# Patient Record
Sex: Female | Born: 1980 | Race: White | Hispanic: No | Marital: Married | Smoking: Never smoker
Health system: Southern US, Community
[De-identification: ages and names within clinical notes are randomized; demographics above are authoritative.]

## PROBLEM LIST (undated history)

## (undated) DIAGNOSIS — Z87440 Personal history of urinary (tract) infections: Secondary | ICD-10-CM

## (undated) DIAGNOSIS — Z8619 Personal history of other infectious and parasitic diseases: Secondary | ICD-10-CM

## (undated) DIAGNOSIS — T7840XA Allergy, unspecified, initial encounter: Secondary | ICD-10-CM

## (undated) DIAGNOSIS — B373 Candidiasis of vulva and vagina: Secondary | ICD-10-CM

## (undated) DIAGNOSIS — D649 Anemia, unspecified: Secondary | ICD-10-CM

## (undated) DIAGNOSIS — IMO0002 Reserved for concepts with insufficient information to code with codable children: Secondary | ICD-10-CM

## (undated) HISTORY — DX: Personal history of other infectious and parasitic diseases: Z86.19

## (undated) HISTORY — DX: Personal history of urinary (tract) infections: Z87.440

## (undated) HISTORY — DX: Reserved for concepts with insufficient information to code with codable children: IMO0002

## (undated) HISTORY — DX: Allergy, unspecified, initial encounter: T78.40XA

## (undated) HISTORY — DX: Anemia, unspecified: D64.9

## (undated) HISTORY — PX: OTHER SURGICAL HISTORY: SHX169

## (undated) HISTORY — PX: WISDOM TOOTH EXTRACTION: SHX21

## (undated) HISTORY — DX: Candidiasis of vulva and vagina: B37.3

---

## 2001-02-10 ENCOUNTER — Other Ambulatory Visit: Admission: RE | Admit: 2001-02-10 | Discharge: 2001-02-10 | Payer: Self-pay | Admitting: Obstetrics and Gynecology

## 2002-08-24 DIAGNOSIS — B3731 Acute candidiasis of vulva and vagina: Secondary | ICD-10-CM

## 2002-08-24 DIAGNOSIS — B373 Candidiasis of vulva and vagina: Secondary | ICD-10-CM

## 2002-08-24 DIAGNOSIS — Z87440 Personal history of urinary (tract) infections: Secondary | ICD-10-CM

## 2002-08-24 HISTORY — DX: Personal history of urinary (tract) infections: Z87.440

## 2002-08-24 HISTORY — DX: Acute candidiasis of vulva and vagina: B37.31

## 2002-08-24 HISTORY — DX: Candidiasis of vulva and vagina: B37.3

## 2004-05-07 ENCOUNTER — Other Ambulatory Visit: Admission: RE | Admit: 2004-05-07 | Discharge: 2004-05-07 | Payer: Self-pay | Admitting: Obstetrics and Gynecology

## 2004-10-02 ENCOUNTER — Ambulatory Visit: Payer: Self-pay | Admitting: Internal Medicine

## 2005-06-25 ENCOUNTER — Other Ambulatory Visit: Admission: RE | Admit: 2005-06-25 | Discharge: 2005-06-25 | Payer: Self-pay | Admitting: Obstetrics and Gynecology

## 2005-08-24 HISTORY — PX: OTHER SURGICAL HISTORY: SHX169

## 2006-06-24 DIAGNOSIS — IMO0002 Reserved for concepts with insufficient information to code with codable children: Secondary | ICD-10-CM

## 2006-06-24 HISTORY — DX: Reserved for concepts with insufficient information to code with codable children: IMO0002

## 2006-06-24 HISTORY — PX: PARTIAL HYMENECTOMY: SHX327

## 2006-06-28 ENCOUNTER — Other Ambulatory Visit: Admission: RE | Admit: 2006-06-28 | Discharge: 2006-06-28 | Payer: Self-pay | Admitting: Obstetrics and Gynecology

## 2006-07-12 ENCOUNTER — Ambulatory Visit (HOSPITAL_COMMUNITY): Admission: RE | Admit: 2006-07-12 | Discharge: 2006-07-12 | Payer: Self-pay | Admitting: Obstetrics and Gynecology

## 2006-08-24 HISTORY — PX: OTHER SURGICAL HISTORY: SHX169

## 2007-12-06 ENCOUNTER — Ambulatory Visit: Payer: Self-pay | Admitting: Internal Medicine

## 2007-12-12 ENCOUNTER — Encounter (INDEPENDENT_AMBULATORY_CARE_PROVIDER_SITE_OTHER): Payer: Self-pay | Admitting: *Deleted

## 2009-01-03 ENCOUNTER — Inpatient Hospital Stay (HOSPITAL_COMMUNITY): Admission: AD | Admit: 2009-01-03 | Discharge: 2009-01-06 | Payer: Self-pay | Admitting: Obstetrics and Gynecology

## 2009-10-03 ENCOUNTER — Telehealth: Payer: Self-pay | Admitting: Internal Medicine

## 2010-07-07 ENCOUNTER — Ambulatory Visit: Payer: Self-pay | Admitting: Internal Medicine

## 2010-07-07 DIAGNOSIS — M533 Sacrococcygeal disorders, not elsewhere classified: Secondary | ICD-10-CM | POA: Insufficient documentation

## 2010-07-07 DIAGNOSIS — J309 Allergic rhinitis, unspecified: Secondary | ICD-10-CM | POA: Insufficient documentation

## 2010-07-11 ENCOUNTER — Ambulatory Visit: Payer: Self-pay | Admitting: Internal Medicine

## 2010-07-15 LAB — CONVERTED CEMR LAB
AST: 22 units/L (ref 0–37)
Albumin: 4 g/dL (ref 3.5–5.2)
Alkaline Phosphatase: 62 units/L (ref 39–117)
Basophils Absolute: 0 10*3/uL (ref 0.0–0.1)
Bilirubin, Direct: 0.1 mg/dL (ref 0.0–0.3)
Calcium: 9.2 mg/dL (ref 8.4–10.5)
Eosinophils Relative: 3.8 % (ref 0.0–5.0)
GFR calc non Af Amer: 87.93 mL/min (ref >60)
Glucose, Bld: 95 mg/dL (ref 70–99)
HDL: 56.3 mg/dL (ref >39.00)
LDL Cholesterol: 114 mg/dL — ABNORMAL HIGH (ref 0–99)
Lymphocytes Relative: 34 % (ref 12.0–46.0)
Monocytes Relative: 8.2 % (ref 3.0–12.0)
Neutrophils Relative %: 53.7 % (ref 43.0–77.0)
Platelets: 330 10*3/uL (ref 150.0–400.0)
Potassium: 4.5 meq/L (ref 3.5–5.1)
RDW: 12.5 % (ref 11.5–14.6)
Sodium: 138 meq/L (ref 135–145)
TSH: 0.75 microintl units/mL (ref 0.35–5.50)
Total Bilirubin: 0.6 mg/dL (ref 0.3–1.2)
Total CHOL/HDL Ratio: 3
VLDL: 26.2 mg/dL (ref 0.0–40.0)
WBC: 6.6 10*3/uL (ref 4.5–10.5)

## 2010-09-21 LAB — CONVERTED CEMR LAB
Basophils Absolute: 0 10*3/uL (ref 0.0–0.1)
Basophils Relative: 0.1 % (ref 0.0–1.0)
Bilirubin, Direct: 0.1 mg/dL (ref 0.0–0.3)
Calcium: 9.4 mg/dL (ref 8.4–10.5)
Cholesterol: 189 mg/dL (ref 0–200)
Creatinine, Ser: 0.9 mg/dL (ref 0.4–1.2)
GFR calc Af Amer: 97 mL/min
Glucose, Bld: 82 mg/dL (ref 70–99)
HCT: 41.8 % (ref 36.0–46.0)
Hemoglobin: 14 g/dL (ref 12.0–15.0)
MCHC: 33.4 g/dL (ref 30.0–36.0)
Monocytes Absolute: 0.4 10*3/uL (ref 0.1–1.0)
Monocytes Relative: 7.9 % (ref 3.0–12.0)
Neutro Abs: 2.6 10*3/uL (ref 1.4–7.7)
RDW: 12.2 % (ref 11.5–14.6)
Sodium: 140 meq/L (ref 135–145)
TSH: 0.55 microintl units/mL (ref 0.35–5.50)
Total Protein: 7.5 g/dL (ref 6.0–8.3)
Triglycerides: 64 mg/dL (ref 0–149)

## 2010-09-23 NOTE — Progress Notes (Signed)
Summary: FLU SYMPTOMS W/FEVER  Phone Note Call from Patient Call back at [phone]   Caller: Patient Reason for Call: Talk to Nurse Summary of Call: PATIENT CALLING C/O OF FEVER, ACHES, SWEATS, CHILLS, FOR 4 DAYS.  PATIENT BELIEVES SHE HAS THE FLU.  SHE IS BREAST FEEDING AND IS CONCERNED.  PATIENT FEELS SHE NEEDS TO BE SEEN BY SOMEONE AT OUR OFFICE TODAY.  SCHEDULES ARE ALL FULL.  PATIENT ASKING TO SPEAK W/ A NURSE. Initial call taken by: Magdalen Spatz [facility],  October 03, 2009 8:32 AM  Follow-up for Phone Call        APPT SCHEDULE.......................Marland KitchenFelecia Deloach CMA  October 03, 2009 8:51 AM

## 2010-09-23 NOTE — Assessment & Plan Note (Signed)
Summary: cpx/cbs /tail bone hurts   Vital Signs:  Patient profile:   30 year old female Height:      63.5 inches Weight:      136.2 pounds BMI:     23.83 Temp:     98.0 degrees F oral Pulse rate:   72 / minute Resp:     14 per minute BP sitting:   110 / 62  (left arm) Cuff size:   large  Vitals Entered By: Shonna Chock CMA (July 07, 2010 1:13 PM) CC: CPX    CC:  CPX .  History of Present Illness:  Kaitlin Hooper is here for a physical; she has had several months of coccygeal area discomfort which does appear to be lessening. She raises question of  a more serious process.  Preventive Screening-Counseling & Management  Caffeine-Diet-Exercise     Does Patient Exercise: yes  Current Medications (verified): 1)  None  Allergies (verified): No Known Drug Allergies  Past History:  Past Medical History: Allergic rhinitis, seasonal  Past Surgical History: Wisdom teeth extraction Gyn surgery 2007: hymenectomy, labial reduction, skin tag removal; G1 P 1   Family History: mother: thyroid nodule benign paternal grandfather: MI >48 paternal grandmother: MI >25 father: open heart surgery, CABG  Social History: Never Smoked Alcohol use-yes (rare) healthy diet Married Regular exercise-yes; 2-3X/ week as weights & cardio  Review of Systems  The patient denies anorexia, fever, weight loss, weight gain, vision loss, decreased hearing, hoarseness, chest pain, syncope, dyspnea on exertion, peripheral edema, prolonged cough, headaches, hemoptysis, abdominal pain, melena, hematochezia, severe indigestion/heartburn, hematuria, suspicious skin lesions, depression, unusual weight change, abnormal bleeding, enlarged lymph nodes, and angioedema.    Physical Exam  General:  well-nourished;alert,appropriate and cooperative throughout examination Head:  Normocephalic and atraumatic without obvious abnormalities. Eyes:  No corneal or conjunctival inflammation noted. Perrla.  Funduscopic exam benign, without hemorrhages, exudates or papilledema. Ears:  External ear exam shows no significant lesions or deformities.  Otoscopic examination reveals clear canals, tympanic membranes are intact bilaterally without bulging, retraction, inflammation or discharge. Hearing is grossly normal bilaterally. Nose:  External nasal examination shows no deformity or inflammation. Nasal mucosa are pink and moist without lesions or exudates. Mouth:  Oral mucosa and oropharynx without lesions or exudates.  Teeth in good repair. Neck:  No deformities, masses, or tenderness noted. Lungs:  Normal respiratory effort, chest expands symmetrically. Lungs are clear to auscultation, no crackles or wheezes. Heart:  Normal rate and regular rhythm. S1 and S2 normal without gallop, murmur, click, rub .S4 Abdomen:  Bowel sounds positive,abdomen soft and non-tender without masses, organomegaly or hernias noted. Aorta palpable ; no AAA Genitalia:  [facility] Msk:  No deformity or scoliosis noted of thoracic or lumbar spine.  Coccyx very superficial & slightly tender to palpation Pulses:  R and L carotid,radial,dorsalis pedis and posterior tibial pulses are full and equal bilaterally Extremities:  No clubbing, cyanosis, edema, or deformity noted with normal full range of motion of all joints.   Neurologic:  alert & oriented X3 and DTRs symmetrical and normal.   Skin:  5  tatooes Cervical Nodes:  No lymphadenopathy noted Axillary Nodes:  No palpable lymphadenopathy Psych:  memory intact for recent and remote, normally interactive, and good eye contact.     Impression & Recommendations:  Problem # 1:  ROUTINE GENERAL MEDICAL EXAM@HEALTH  CARE FACL (ICD-V70.0)  Problem # 2:  COCCYGEAL PAIN (ICD-724.79)  Orders: T-Coccyx/Sacrum 2 Views (72220TC) to be done with next menses  Problem # 3:  ALLERGIC RHINITIS (ICD-477.9)  Her updated medication list for this problem includes:    Loratadine  10 Mg Tabs (Loratadine) .Marland Kitchen... 1 once daily as needed for allergies  Complete Medication List: 1)  Loratadine 10 Mg Tabs (Loratadine) .Marland Kitchen.. 1 once daily as needed for allergies  Patient Instructions: 1)  Neti pot once daily as needed for congestion.Xray of coccyx with next menses . Use a rubber donut as needed. Consider fasting labs: 2)  BMP; 3)  Hepatic Panel ; 4)  Lipid Panel ; 5)  TSH; 6)  CBC w/ Diff . Prescriptions: LORATADINE 10 MG TABS (LORATADINE) 1 once daily as needed for allergies  #90 x 3   Entered and Authorized by:   Marga Melnick MD   Signed by:   Marga Melnick MD on 07/07/2010   Method used:   Print then Give to Patient   RxID:   [id number]    Orders Added: 1)  Est. Patient 18-39 years [99395] 2)  T-Coccyx/Sacrum 2 Views [72220TC]

## 2010-11-21 ENCOUNTER — Observation Stay (HOSPITAL_COMMUNITY)
Admission: AD | Admit: 2010-11-21 | Discharge: 2010-11-22 | Disposition: A | Discharge status: Home / Self Care | Payer: 59 | Source: Ambulatory Visit | Attending: Obstetrics and Gynecology | Admitting: Obstetrics and Gynecology

## 2010-11-21 ENCOUNTER — Inpatient Hospital Stay (HOSPITAL_COMMUNITY): Payer: 59

## 2010-11-21 DIAGNOSIS — R1032 Left lower quadrant pain: Secondary | ICD-10-CM | POA: Insufficient documentation

## 2010-11-21 DIAGNOSIS — N211 Calculus in urethra: Secondary | ICD-10-CM | POA: Insufficient documentation

## 2010-11-21 DIAGNOSIS — N200 Calculus of kidney: Principal | ICD-10-CM | POA: Insufficient documentation

## 2010-11-21 LAB — CBC
HCT: 39.8 % (ref 36.0–46.0)
Hemoglobin: 13.7 g/dL (ref 12.0–15.0)
MCH: 30.5 pg (ref 26.0–34.0)
MCHC: 34.4 g/dL (ref 30.0–36.0)
RDW: 12.8 % (ref 11.5–15.5)

## 2010-11-21 LAB — COMPREHENSIVE METABOLIC PANEL
Alkaline Phosphatase: 54 U/L (ref 39–117)
BUN: 13 mg/dL (ref 6–23)
Calcium: 9.9 mg/dL (ref 8.4–10.5)
GFR calc non Af Amer: >60 mL/min (ref >60)
Glucose, Bld: 122 mg/dL — ABNORMAL HIGH (ref 70–99)
Total Protein: 7.4 g/dL (ref 6.0–8.3)

## 2010-11-21 LAB — URINALYSIS, ROUTINE W REFLEX MICROSCOPIC
Glucose, UA: NEGATIVE mg/dL
Ketones, ur: NEGATIVE mg/dL
Leukocytes, UA: NEGATIVE
Nitrite: NEGATIVE
Protein, ur: NEGATIVE mg/dL
pH: 7.5 (ref 5.0–8.0)

## 2010-11-21 LAB — URINE MICROSCOPIC-ADD ON

## 2010-11-23 LAB — URINE CULTURE: Colony Count: 15000

## 2010-11-26 NOTE — Discharge Summary (Signed)
  [name], Kaitlin Hooper            ACCOUNT NO.:  [id number]  MEDICAL RECORD NO.:  [id number]           PATIENT TYPE:  I  LOCATION:  9318                          [facility]:  [facility]  PHYSICIAN:  Janine Limbo, M.D.DATE OF BIRTH:  01-08-1981  DATE OF ADMISSION:  11/21/2010 DATE OF DISCHARGE:  11/22/2010                              DISCHARGE SUMMARY   ADMISSION DIAGNOSIS:  Left ureteral kidney stone.  DISCHARGE DIAGNOSIS:  Left ureteral kidney stone.  PROCEDURE THIS ADMISSION:  CT scan on November 04, 2010.  HISTORY OF PRESENT ILLNESS:  Ms. Kaitlin Hooper is a 30 year old female, para 1-0-1-1, who presented to the emergency department at [facility] [place] on November 04, 2010, complaining of sharp left lower quadrant pain.  Her evaluation showed a hemoglobin of 13.7.  Her hematocrit was 39.8%.  Her white blood cell count was 10,100.  Her platelet count was 343,000.  Her urinalysis showed bacteria but also a trace amount of blood.  Her comprehensive metabolic panel was within normal limits.  The patient was given pain medications and IV fluids.  Her discomfort did not resolve.  A CT scan was performed that showed a 2-mm left distal ureteral stone.  The decision was made to admit the patient for treatment and observation.  PHYSICAL EXAMINATION:  The patient's abdomen was soft and nontender. She did not have CVA tenderness.  HOSPITAL COURSE:  The patient was started on Ancef 1 g IV every 6 hours. She was given vigorous IV fluid hydration.  On the morning of November 22, 2010, the patient reported that her pain had resolved.  She quickly tolerated a regular diet and she was adequately relieved with Pain Medicine by mouth.  The decision was made to discharge the patient to home.  DISCHARGE INSTRUCTIONS:  The patient will call for questions or concerns.  She will call for any increase in discomfort.  She will return to the office in 3 weeks for followup examination.  DISCHARGE  MEDICATIONS: 1. Motrin 800 mg 1 tablet every 8 hours as needed for mild-to-moderate     pain. 2. Percocet 5/325 1-2 tablets every 4 hours as needed for severe pain. 3. Phenergan 25 mg 1 tablet every 6 hours as needed for nausea. 4. Septra DS 1 tablet twice each day for 7 days. 5. Diflucan 150 mg 1 tablet on 1 occasion only as needed for yeast     vaginitis.     Janine Limbo, M.D.     AVS/MEDQ  D:  11/22/2010  T:  11/23/2010  Job:  [id number]  Electronically Signed by Kirkland Hun M.D. on 11/26/2010 11:15:28 AM

## 2010-12-02 LAB — CBC
HCT: 38.6 % (ref 36.0–46.0)
MCHC: 35.1 g/dL (ref 30.0–36.0)
MCV: 93.6 fL (ref 78.0–100.0)
MCV: 94.9 fL (ref 78.0–100.0)
Platelets: 247 10*3/uL (ref 150–400)
RBC: 4.12 MIL/uL (ref 3.87–5.11)
RDW: 13.5 % (ref 11.5–15.5)
WBC: 11 10*3/uL — ABNORMAL HIGH (ref 4.0–10.5)

## 2011-01-06 NOTE — H&P (Signed)
NAMEARKIE, TAGLIAFERRO NO.:  [id number]   MEDICAL RECORD NO.:  [id number]          PATIENT TYPE:  INP   LOCATION:  9166                          [facility]ILITY:  [facility]   PHYSICIAN:  Dois Davenport A. Rivard, M.D. DATE OF BIRTH:  [birth year]   DATE OF ADMISSION:  01/03/2009  DATE OF DISCHARGE:                              HISTORY & PHYSICAL   Ms. Kaitlin Hooper is a 30 year old gravida 1, para 0 at 39-4/7 weeks who  presents from the office status post evaluation for questionable leaking  with positive pulling and positive fern noted at that time.  Cervix was  3 cm in the office.  There was clear fluid noted.  The patient denied  any contractions.  She may have started leaking in the early morning  hours.   Pregnancy is remarkable for:  1. History of vestibulitis.  2. History of reaction to Monocryl sutures when used for labioplasty.  3. Group B strep negative.   PRENATAL LABORATORY DATA:  Blood type is O+, Rh antibody negative, VDRL  nonreactive, rubella titer positive, hepatitis B surface antigen  negative, HIV was nonreactive.  GC and chlamydia were negative in  October.  Cultures were declined at 36 weeks.  First trimester screen  was normal.  AFP was declined.  Glucola was normal.  Hemoglobin upon  entering the practice was 13.9.  It was 12.0 at 28 weeks.  RPR was  nonreactive.  Group B strep culture was negative at 36 weeks.   HISTORY OF PRESENT PREGNANCY:  The patient entered care at approximately  9 weeks.  She had an ultrasound done at her first visit for dating which  gave an [facility] of Jan 06, 2009, which was approximately different from her  LMP.  First trimester screen was normal.  HIV was done with her second  first trimester screen lab draw which was negative.  She declined AFP.  At 18 weeks, she had another ultrasound showing normal growth with a  posterior placenta.  She had a Glucola at 26 weeks.  At that time, she  was at measuring slightly size less than dates.   However, this rectified  itself at the next visit.  At 30 weeks, she was having significant  issues of sciatica.  She also reported that when she had her vaginal  skin tag repair she did have a reaction to Monocryl suture.  She had  ultrasound at 36 weeks showing normal growth and fluid.  Group B strep  culture was negative at 36 weeks.   OBSTETRICAL HISTORY:  The patient is a primigravida.   MEDICAL HISTORY:  She is a previous Ortho Tri-Cyclen Lo user.  She  stopped in June 2009.  She reports usual childhood illnesses.   SURGICAL HISTORY:  Includes a vaginoplasty labioplasty in 2007 which had  a skin tag removed and had the hymen opened.  She also had her wisdom  teeth removed in '96.  During that vaginoplasty surgery, she did have a  reaction to MONOCRYL suture, and therefore subsequent surgeries we will  recommend Vicryl or chromic.   The patient has a sensitivity to MONOCRYL  sutures.  She has no drug  allergies.   FAMILY HISTORY:  Her paternal grandmother and paternal grandfather had  heart attacks.  Her father had open heart surgery.  Her mother has  elevated cholesterol.  Her maternal grandmother has elevated  cholesterol.  Her maternal grandmother has elevated blood pressure on  medication.  Her mother has a nodule on her thyroid and had one half of  her thyroid removed.  She has no medications now.  Paternal grandmother  had breast cancer.  Paternal grandfather was an alcoholic.   GENETIC HISTORY:  Is unremarkable.   SOCIAL HISTORY:  The patient is married to the father of baby.  He is  involved and supportive.  His name is Gretchen Portela.  The patient is  Caucasian.  She denies religious affiliation.  She has a bachelor's  degree.  She has been a Veterinary surgeon.  She also works at [employer].  Her  husband has a bachelor's degree and is an Retail banker.  She has  been followed by the certified nurse midwife service at [facility].  She denies any alcohol, drug  or tobacco use during this pregnancy.   PHYSICAL EXAMINATION:  VITAL SIGNS:  Stable.  The patient is febrile.  HEENT:  Within normal limits.  LUNGS:  Breath sounds are clear.  HEART:  Regular rate and rhythm without murmur.  BREASTS:  Soft and nontender.  ABDOMEN:  Fundal height is approximately 39 cm.  Estimated fetal weight  7-8 pounds.  Uterine contractions every 3-5 minutes, 50 seconds in  duration, mild quality.  Fetal heart rate is reactive with no  decelerations.  The patient is noted be leaking clear fluid.  Cervix is  2+, 80%, vertex at minus 1 station.  Cervix was slightly posterior.  EXTREMITIES:  Deep tendon reflexes are 2+ without clonus.  There is a  trace edema noted.   IMPRESSION:  1. Intrauterine pregnancy at 39-4/7 weeks.  2. Probable prolonged rupture of membranes.  3. Negative group B strep.   PLAN:  1. Admit to birthing suite per consult with Dr. Estanislado Pandy as attending      physician.  2. Routine certified nurse midwife orders.  3. Reviewed status of probable premature prolonged rupture of      membranes prior to labor with the patient and her husband and      addressed the risk issues of infection.  I recommended the patient      consider Pitocin augmentation.  The patient and her husband were      agreeable with this plan.  4. Epidural p.r.n.  5. Plan chromic or Vicryl sutures secondary to history of reaction to      Monocryl sutures.      Renaldo Reel Emilee Hero, C.N.M.      Crist Fat Rivard, M.D.  Electronically Signed    VLL/MEDQ  D:  01/03/2009  T:  01/03/2009  Job:  [id number]

## 2011-01-09 NOTE — Op Note (Signed)
Kaitlin Hooper, Kaitlin Hooper            ACCOUNT NO.:  [id number]   MEDICAL RECORD NO.:  [id number]          PATIENT TYPE:  AMB   LOCATION:  SDC                           [facility]:  [facility]   PHYSICIAN:  Dois Davenport A. Rivard, M.D. DATE OF BIRTH:  03/25/1981   DATE OF PROCEDURE:  07/12/2006  DATE OF DISCHARGE:                                 OPERATIVE REPORT   PREOPERATIVE DIAGNOSIS:  Dyspareunia.   POSTOPERATIVE DIAGNOSIS:  Dyspareunia.   ANESTHESIA:  General.   PROCEDURE:  Partial hymenectomy and vulvoplasty.   SURGEON:  Crist Fat. Rivard, M.D.   ASSISTANTMarquis Lunch. Adline Peals.   ESTIMATED BLOOD LOSS:  Minimal.   PROCEDURE:  After being informed of the planned procedure with possible  complications, including bleeding, infection and scar tissue -- informed was  obtained.  The patient was taken to OR #3, given general anesthesia with  laryngeal mask without complication.  She was placed in the lithotomy  position, prepped and draped in a sterile fashion.   Gynecologic exam reveals a midline high mantle band that had ruptured three-  quarters posteriorly, with a remnant anteriorly and posteriorly as well as a  thickened posterior hymen that has remained intact.  It also reveals  enlarged upper portion of labia minora bilaterally.  We start by  infiltrating the posterior hymen and sharply remove it from 4 o'clock to 7  o'clock.  The vaginal mucosa is slightly undermined and is then closed  paradoxically with simple sutures of 4-0 Monocryl.  Hemostasis is adequate.  The anterior portion of the hymeneal band near the urethral opening is then  infiltrated with Nesacaine 1% (just as the posterior hymen was previously)  and resected.  Hemostasis was completed with cautery and the skin was closed  with simple sutures of 4-0 Monocryl.  Labia minores were then marked for the  site of excision, infiltrated with Nesacaine 1%.  We proceed with excision  of the excess upper portion of labia minora.   Under-skin hemostasis was  completed with cautery and then the skin on each side is closed with simple  sutures of 4-0 Monocryl.  Hemostasis was adequate.  After the procedure we  note a vulvar anatomy return to normal.  Estrace cream was applied to the  site.  Instruments and sponge count was complete x2.  Estimated blood loss  was minimal.  Procedure was very well tolerated by the patient, who was  taken to the recovery room in a well and stable condition.     Crist Fat Rivard, M.D.  Electronically Signed    SAR/MEDQ  D:  07/12/2006  T:  07/12/2006  Job:  [id number]

## 2011-08-25 NOTE — L&D Delivery Note (Signed)
Delivery Note At 5:36 AM a viable female was delivered via Vaginal, Spontaneous Delivery (Presentation: ; Occiput Anterior).  Loose nuchal cord easily reduced, APGAR: 9, 9; weight . pending  Placenta status: Intact, Spontaneous.  Cord: 3 vessels with the following complications: None.  Cord pH: n/a  Anesthesia: Epidural  Episiotomy: None Lacerations: sm r periurethral hemostatic, no repair Suture Repair: n/a Est. Blood Loss (mL): 250  Mom to postpartum.  Baby to nursery-stable. Skin-skin Mom plans to BF   Elesa Garman M 01/08/2012, 6:02 AM

## 2011-11-05 ENCOUNTER — Encounter (INDEPENDENT_AMBULATORY_CARE_PROVIDER_SITE_OTHER): Payer: 59

## 2011-11-05 DIAGNOSIS — Z331 Pregnant state, incidental: Secondary | ICD-10-CM

## 2011-12-01 ENCOUNTER — Ambulatory Visit (INDEPENDENT_AMBULATORY_CARE_PROVIDER_SITE_OTHER): Payer: 59 | Admitting: Obstetrics and Gynecology

## 2011-12-01 ENCOUNTER — Encounter: Payer: Self-pay | Admitting: Obstetrics and Gynecology

## 2011-12-01 VITALS — BP 92/64 | Wt 165.0 lb

## 2011-12-01 DIAGNOSIS — Z3480 Encounter for supervision of other normal pregnancy, unspecified trimester: Secondary | ICD-10-CM

## 2011-12-01 NOTE — Patient Instructions (Signed)

## 2011-12-01 NOTE — Progress Notes (Signed)
Pressure- Present Having B/H No vaginal discharge

## 2011-12-02 DIAGNOSIS — Z3480 Encounter for supervision of other normal pregnancy, unspecified trimester: Secondary | ICD-10-CM | POA: Insufficient documentation

## 2011-12-02 NOTE — Progress Notes (Signed)
Patient ID: Kaitlin Hooper, female   DOB: 05/13/1981, 31 y.o.   MRN: [id number] S: feeling uc off and on not regular, requests no vag exam abd soft, gravid, nt VTX per leopolds A: 33 week IUP P s/s PTL kick counts reviewed,  Sharman Crate

## 2011-12-17 ENCOUNTER — Ambulatory Visit (INDEPENDENT_AMBULATORY_CARE_PROVIDER_SITE_OTHER): Payer: 59 | Admitting: Obstetrics and Gynecology

## 2011-12-17 VITALS — BP 100/60 | Wt 165.0 lb

## 2011-12-17 DIAGNOSIS — Z331 Pregnant state, incidental: Secondary | ICD-10-CM

## 2011-12-17 NOTE — Progress Notes (Signed)
BStrep. Doing Well. RTO 1 week. AVS

## 2011-12-20 LAB — CULTURE, BETA STREP (GROUP B ONLY)

## 2011-12-24 ENCOUNTER — Ambulatory Visit (INDEPENDENT_AMBULATORY_CARE_PROVIDER_SITE_OTHER): Payer: 59 | Admitting: Obstetrics and Gynecology

## 2011-12-24 ENCOUNTER — Encounter: Payer: Self-pay | Admitting: Obstetrics and Gynecology

## 2011-12-24 VITALS — BP 100/62 | Wt 167.0 lb

## 2011-12-24 DIAGNOSIS — Z3480 Encounter for supervision of other normal pregnancy, unspecified trimester: Secondary | ICD-10-CM

## 2011-12-24 NOTE — Progress Notes (Signed)
Patient ID: Kaitlin Hooper, female   DOB: 1981-08-21, 31 y.o.   MRN: [id number] Reviewed s/s   Vag 1 60 -2 VTX Intact GBS neg labor, uc, srom, vag bleeding, ha, visual spots or blurring, epigastric pain, edema to report, and kick counts. F/o office as scheduled. Lavera Guise, CNM

## 2011-12-24 NOTE — Progress Notes (Signed)
Pt c/o thicker discharge than normal

## 2011-12-31 ENCOUNTER — Ambulatory Visit (INDEPENDENT_AMBULATORY_CARE_PROVIDER_SITE_OTHER): Payer: 59 | Admitting: Obstetrics and Gynecology

## 2011-12-31 VITALS — BP 100/60 | Wt 167.0 lb

## 2011-12-31 DIAGNOSIS — Z331 Pregnant state, incidental: Secondary | ICD-10-CM

## 2011-12-31 NOTE — Progress Notes (Signed)
Returned office in 1 week.  Dr. Stefano Gaul

## 2011-12-31 NOTE — Progress Notes (Signed)
Pt states she has no concerns today.  

## 2012-01-07 ENCOUNTER — Telehealth: Payer: Self-pay | Admitting: Obstetrics and Gynecology

## 2012-01-07 ENCOUNTER — Ambulatory Visit (INDEPENDENT_AMBULATORY_CARE_PROVIDER_SITE_OTHER): Payer: 59 | Admitting: Obstetrics and Gynecology

## 2012-01-07 VITALS — BP 110/68 | Wt 168.0 lb

## 2012-01-07 DIAGNOSIS — O368190 Decreased fetal movements, unspecified trimester, not applicable or unspecified: Secondary | ICD-10-CM

## 2012-01-07 NOTE — Telephone Encounter (Signed)
Pam/Avs appt today at 4:30/epic

## 2012-01-07 NOTE — Progress Notes (Signed)
Pt believes she lost her mucous plug . C/O: decreased fetal movement today pt was worked in to arrive at 11:30am rather than 4:30pm.

## 2012-01-07 NOTE — Telephone Encounter (Signed)
Pt called and stated that she was having decrease fetal movement sarting on last pm. Pt was already on schedule to come in today @ 4:00 pm although pt was advised to come in @ 11:30 am. Pt had no other symptoms. She had eaten a good breakfast and had been drinking fluids. Pt went to the gym this am worked out on the treadmill and the baby still was not moving like usual. Mathis Bud

## 2012-01-07 NOTE — Progress Notes (Signed)
Patient reports that the baby doesn't move as much as it used to but that it does move every day.  Nonstress test is reactive.  Membranes stripped.  Return to office in 1 week.  Patient wants [place] to consider induction.

## 2012-01-08 ENCOUNTER — Encounter (HOSPITAL_COMMUNITY): Payer: Self-pay | Admitting: Anesthesiology

## 2012-01-08 ENCOUNTER — Encounter (HOSPITAL_COMMUNITY): Payer: Self-pay | Admitting: *Deleted

## 2012-01-08 ENCOUNTER — Inpatient Hospital Stay (HOSPITAL_COMMUNITY): Payer: Managed Care, Other (non HMO) | Admitting: Anesthesiology

## 2012-01-08 ENCOUNTER — Inpatient Hospital Stay (HOSPITAL_COMMUNITY)
Admission: AD | Admit: 2012-01-08 | Discharge: 2012-01-09 | DRG: 775 | Disposition: A | Discharge status: Home / Self Care | Payer: Managed Care, Other (non HMO) | Source: Ambulatory Visit | Attending: Obstetrics and Gynecology | Admitting: Obstetrics and Gynecology

## 2012-01-08 LAB — OB RESULTS CONSOLE HEPATITIS B SURFACE ANTIGEN: Hepatitis B Surface Ag: NEGATIVE

## 2012-01-08 LAB — CBC
HCT: 33.5 % — ABNORMAL LOW (ref 36.0–46.0)
Hemoglobin: 10.9 g/dL — ABNORMAL LOW (ref 12.0–15.0)
MCH: 28.3 pg (ref 26.0–34.0)
MCHC: 32.5 g/dL (ref 30.0–36.0)
MCV: 87 fL (ref 78.0–100.0)
Platelets: 410 10*3/uL — ABNORMAL HIGH (ref 150–400)
RBC: 3.85 MIL/uL — ABNORMAL LOW (ref 3.87–5.11)
RDW: 13.9 % (ref 11.5–15.5)
WBC: 14.9 10*3/uL — ABNORMAL HIGH (ref 4.0–10.5)

## 2012-01-08 LAB — RPR: RPR Ser Ql: NONREACTIVE

## 2012-01-08 LAB — OB RESULTS CONSOLE RPR: RPR: NONREACTIVE

## 2012-01-08 LAB — OB RESULTS CONSOLE ABO/RH: RH Type: POSITIVE

## 2012-01-08 LAB — OB RESULTS CONSOLE HIV ANTIBODY (ROUTINE TESTING): HIV: NONREACTIVE

## 2012-01-08 MED ORDER — LACTATED RINGERS IV SOLN: INTRAVENOUS | Status: DC

## 2012-01-08 MED ORDER — LIDOCAINE HCL (PF) 1 % IJ SOLN
30.0000 mL | INTRAMUSCULAR | Status: DC | PRN
  Filled 2012-01-08: qty 30

## 2012-01-08 MED ORDER — SENNOSIDES-DOCUSATE SODIUM 8.6-50 MG PO TABS
2.0000 | ORAL_TABLET | Freq: Every day | ORAL | Status: DC
  Administered 2012-01-08: 2 via ORAL

## 2012-01-08 MED ORDER — PRENATAL MULTIVITAMIN CH
1.0000 | ORAL_TABLET | Freq: Every day | ORAL | Status: DC
  Administered 2012-01-08 – 2012-01-09 (×2): 1 via ORAL
  Filled 2012-01-08 (×2): qty 1

## 2012-01-08 MED ORDER — ONDANSETRON HCL 4 MG/2ML IJ SOLN: 4.0000 mg | Freq: Four times a day (QID) | INTRAMUSCULAR | Status: DC | PRN

## 2012-01-08 MED ORDER — DIPHENHYDRAMINE HCL 25 MG PO CAPS: 25.0000 mg | ORAL_CAPSULE | Freq: Four times a day (QID) | ORAL | Status: DC | PRN

## 2012-01-08 MED ORDER — DIBUCAINE 1 % RE OINT: 1.0000 "application " | TOPICAL_OINTMENT | RECTAL | Status: DC | PRN

## 2012-01-08 MED ORDER — EPHEDRINE 5 MG/ML INJ
10.0000 mg | INTRAVENOUS | Status: DC | PRN
  Filled 2012-01-08: qty 4

## 2012-01-08 MED ORDER — DIPHENHYDRAMINE HCL 50 MG/ML IJ SOLN: 12.5000 mg | INTRAMUSCULAR | Status: DC | PRN

## 2012-01-08 MED ORDER — LANOLIN HYDROUS EX OINT: TOPICAL_OINTMENT | CUTANEOUS | Status: DC | PRN

## 2012-01-08 MED ORDER — BENZOCAINE-MENTHOL 20-0.5 % EX AERO: 1.0000 "application " | INHALATION_SPRAY | CUTANEOUS | Status: DC | PRN

## 2012-01-08 MED ORDER — LACTATED RINGERS IV SOLN: 500.0000 mL | Freq: Once | INTRAVENOUS | Status: DC

## 2012-01-08 MED ORDER — OXYCODONE-ACETAMINOPHEN 5-325 MG PO TABS: 1.0000 | ORAL_TABLET | ORAL | Status: DC | PRN

## 2012-01-08 MED ORDER — TETANUS-DIPHTH-ACELL PERTUSSIS 5-2.5-18.5 LF-MCG/0.5 IM SUSP
0.5000 mL | Freq: Once | INTRAMUSCULAR | Status: AC
  Administered 2012-01-09: 0.5 mL via INTRAMUSCULAR
  Filled 2012-01-08: qty 0.5

## 2012-01-08 MED ORDER — IBUPROFEN 600 MG PO TABS: 600.0000 mg | ORAL_TABLET | Freq: Four times a day (QID) | ORAL | Status: DC | PRN

## 2012-01-08 MED ORDER — ACETAMINOPHEN 325 MG PO TABS: 650.0000 mg | ORAL_TABLET | ORAL | Status: DC | PRN

## 2012-01-08 MED ORDER — IBUPROFEN 600 MG PO TABS
600.0000 mg | ORAL_TABLET | Freq: Four times a day (QID) | ORAL | Status: DC
  Administered 2012-01-08 – 2012-01-09 (×4): 600 mg via ORAL
  Filled 2012-01-08 (×4): qty 1

## 2012-01-08 MED ORDER — FENTANYL 2.5 MCG/ML BUPIVACAINE 1/10 % EPIDURAL INFUSION (WH - ANES)
14.0000 mL/h | INTRAMUSCULAR | Status: DC
  Filled 2012-01-08 (×2): qty 60

## 2012-01-08 MED ORDER — OXYTOCIN 20 UNITS IN LACTATED RINGERS INFUSION - SIMPLE
125.0000 mL/h | Freq: Once | INTRAVENOUS | Status: AC
  Administered 2012-01-08: 125 mL/h via INTRAVENOUS

## 2012-01-08 MED ORDER — ZOLPIDEM TARTRATE 5 MG PO TABS: 5.0000 mg | ORAL_TABLET | Freq: Every evening | ORAL | Status: DC | PRN

## 2012-01-08 MED ORDER — EPHEDRINE 5 MG/ML INJ: 10.0000 mg | INTRAVENOUS | Status: DC | PRN

## 2012-01-08 MED ORDER — MEASLES, MUMPS & RUBELLA VAC ~~LOC~~ INJ
0.5000 mL | INJECTION | Freq: Once | SUBCUTANEOUS | Status: DC
  Filled 2012-01-08: qty 0.5

## 2012-01-08 MED ORDER — OXYTOCIN 10 UNIT/ML IJ SOLN: 10.0000 [IU] | Freq: Once | INTRAMUSCULAR | Status: DC

## 2012-01-08 MED ORDER — ONDANSETRON HCL 4 MG/2ML IJ SOLN: 4.0000 mg | INTRAMUSCULAR | Status: DC | PRN

## 2012-01-08 MED ORDER — SIMETHICONE 80 MG PO CHEW: 80.0000 mg | CHEWABLE_TABLET | ORAL | Status: DC | PRN

## 2012-01-08 MED ORDER — OXYTOCIN BOLUS FROM INFUSION
500.0000 mL | Freq: Once | INTRAVENOUS | Status: DC
  Filled 2012-01-08: qty 500
  Filled 2012-01-08: qty 1000

## 2012-01-08 MED ORDER — FENTANYL 2.5 MCG/ML BUPIVACAINE 1/10 % EPIDURAL INFUSION (WH - ANES)
INTRAMUSCULAR | Status: DC | PRN
  Administered 2012-01-08: 14 mL/h via EPIDURAL

## 2012-01-08 MED ORDER — WITCH HAZEL-GLYCERIN EX PADS: 1.0000 "application " | MEDICATED_PAD | CUTANEOUS | Status: DC | PRN

## 2012-01-08 MED ORDER — PHENYLEPHRINE 40 MCG/ML (10ML) SYRINGE FOR IV PUSH (FOR BLOOD PRESSURE SUPPORT)
80.0000 ug | PREFILLED_SYRINGE | INTRAVENOUS | Status: DC | PRN
  Filled 2012-01-08: qty 5

## 2012-01-08 MED ORDER — LACTATED RINGERS IV SOLN: 500.0000 mL | INTRAVENOUS | Status: DC | PRN

## 2012-01-08 MED ORDER — FENTANYL CITRATE 0.05 MG/ML IJ SOLN: 100.0000 ug | INTRAMUSCULAR | Status: DC | PRN

## 2012-01-08 MED ORDER — LIDOCAINE HCL (PF) 1 % IJ SOLN
INTRAMUSCULAR | Status: DC | PRN
  Administered 2012-01-08 (×2): 4 mL

## 2012-01-08 MED ORDER — ONDANSETRON HCL 4 MG PO TABS: 4.0000 mg | ORAL_TABLET | ORAL | Status: DC | PRN

## 2012-01-08 MED ORDER — CITRIC ACID-SODIUM CITRATE 334-500 MG/5ML PO SOLN: 30.0000 mL | ORAL | Status: DC | PRN

## 2012-01-08 MED ORDER — PHENYLEPHRINE 40 MCG/ML (10ML) SYRINGE FOR IV PUSH (FOR BLOOD PRESSURE SUPPORT): 80.0000 ug | PREFILLED_SYRINGE | INTRAVENOUS | Status: DC | PRN

## 2012-01-08 NOTE — H&P (Signed)
Kaitlin Hooper is a 31 y.o. female presenting for onset of ctx since this evening, now becoming stronger and closer together. Denies any VB or LOF, +FM. Pt had NST in office today that was nl for c/o dec FM.  Pregnancy significant for: hx vestibulitis after vaginoplasty r/t monocryl suture  Hx CP cyst at anatomy scan  HPI: Pt began prenatal care at 10weeks. She had a viability [place] at 7w S=D. 1st trimester screen normal. No documentation of AFP results but there is mention of it being drawn at 18w. Anatomy scan at that time was normal, except for L CP cyst that resolved by next [place]. There is not any further hollister in chart, until 33wks. Pt reports having regular care and no further complications. GBS neg at 36wks.   Maternal Medical History:  Reason for admission: Reason for admission: contractions.  Contractions: Onset was 3-5 hours ago.   Frequency: regular.   Duration is approximately 60 seconds.   Perceived severity is moderate.    Fetal activity: Perceived fetal activity is normal.   Last perceived fetal movement was within the past hour.    Prenatal complications: no prenatal complications   OB History    Grav Para Term Preterm Abortions TAB SAB Ect Mult Living   3 1 1  1  1   1      Past Medical History  Diagnosis Date  . History of chicken pox   . Hx: UTI (urinary tract infection) 2004  . Monilial vaginitis 2004  . History of dyspareunia 11/ 2007    secondary to hymenal band + excessive labia minora  . No pertinent past medical history    Past Surgical History  Procedure Date  . Wisdom tooth extraction   . Partial hymenectomy 06/2006  . Vestibulitis 2008  . Vulvoplasty 2007  . No past surgeries    Family History: family history includes Alcohol abuse in her maternal grandfather; Heart disease in her father, paternal grandfather, and paternal grandmother; and Hypertension in her maternal grandmother. Social History:  reports that she has never smoked. She has never  used smokeless tobacco. She reports that she does not drink alcohol or use illicit drugs.  Review of Systems  All other systems reviewed and are negative.    Dilation: 4.5 Effacement (%): 100 Blood pressure 120/89, pulse 108, temperature 98.1 F (36.7 C), temperature source Oral, resp. rate 18, last menstrual period 04/12/2011. Maternal Exam:  Uterine Assessment: Contraction strength is moderate.  Contraction duration is 60 seconds. Contraction frequency is regular.   Abdomen: Patient reports no abdominal tenderness. Fundal height is aga.   Fetal presentation: vertex  Introitus: Normal vulva. Normal vagina.  Pelvis: adequate for delivery.   Cervix: Cervix evaluated by digital exam.     Fetal Exam Fetal Monitor Review: Mode: ultrasound.   Baseline rate: 150.  Variability: moderate (6-25 bpm).   Pattern: no decelerations and no accelerations.    Fetal State Assessment: Category II - tracings are indeterminate.     Physical Exam  Nursing note and vitals reviewed. Constitutional: She is oriented to person, place, and time. She appears well-developed and well-nourished.  HENT:  Head: Normocephalic.  Neck: Normal range of motion.  Cardiovascular: Normal rate, regular rhythm and normal heart sounds.   Respiratory: Effort normal and breath sounds normal.  GI: Soft. Bowel sounds are normal.  Genitourinary: Vagina normal.  Musculoskeletal: Normal range of motion.  Neurological: She is alert and oriented to person, place, and time.  Skin: Skin is  warm and dry.  Psychiatric: She has a normal mood and affect. Her behavior is normal.    Prenatal labs: ABO, Rh:  OPOS Antibody:  neg Rubella:  IMM RPR:   NR HBsAg:   neg HIV:   neg GBS: Negative (04/25 0000)  Pap/GC/CT neg UA cx neg  Assessment/Plan: IUP at [redacted]w[redacted]d GBS neg FHR cat 2 no accels at present, has not been on EFM very long  Early/active labor  Admit to b.s per Dr Pennie Rushing attending Epidural ASAP Expectant  mgmnt, CTO FHR closely  Request complete hollister from office in AM  Kaitlin Hooper M 01/08/2012, 1:50 AM

## 2012-01-08 NOTE — Progress Notes (Signed)
Patient ID: Kaitlin Hooper, female   DOB: [birth year], 31 y.o.   MRN: [id number] .Subjective:  Comfortable with epidural   Objective: BP 122/86  Pulse 145  Temp(Src) 98.2 F (36.8 C) (Oral)  Resp 18  Ht 5' 3.5" (1.613 m)  SpO2 97%  LMP 04/12/2011   FHT:  FHR: 150 bpm, variability: moderate,  accelerations:  Present,  decelerations:  Present occ mild variables UC:   regular, every 2-3 minutes SVE:   Dilation: 7.5 Effacement (%): 100 Station: -1;0 Exam by:: S.Lilliard,cnm  AROM clear fluid   Assessment / Plan: Spontaneous labor, progressing normally GBS neg   Fetal Wellbeing:  Category I Pain Control:  Epidural  Expectant mgmnt  Update physician PRN  Allysen Lazo M 01/08/2012, 4:06 AM

## 2012-01-08 NOTE — Anesthesia Procedure Notes (Signed)
Epidural Patient location during procedure: OB Start time: 01/08/2012 2:25 AM  Staffing Anesthesiologist: Izreal Kock A. Performed by: anesthesiologist   Preanesthetic Checklist Completed: patient identified, site marked, surgical consent, pre-op evaluation, timeout performed, IV checked, risks and benefits discussed and monitors and equipment checked  Epidural Patient position: sitting Prep: site prepped and draped and DuraPrep Patient monitoring: continuous pulse ox and blood pressure Approach: midline Injection technique: LOR air  Needle:  Needle type: Tuohy  Needle gauge: 17 G Needle length: 9 cm Needle insertion depth: 5 cm cm Catheter type: closed end flexible Catheter size: 19 Gauge Catheter at skin depth: 10 cm Test dose: negative and Other  Assessment Events: blood not aspirated, injection not painful, no injection resistance, negative IV test and no paresthesia  Additional Notes Patient identified. Risks and benefits discussed including failed block, incomplete  Pain control, post dural puncture headache, nerve damage, paralysis, blood pressure Changes, nausea, vomiting, reactions to medications-both toxic and allergic and post Partum back pain. All questions were answered. Patient expressed understanding and wished to proceed. Sterile technique was used throughout procedure. Epidural site was Dressed with sterile barrier dressing. No paresthesias, signs of intravascular injection Or signs of intrathecal spread were encountered.  Patient was more comfortable after the epidural was dosed. Please see RN's note for documentation of vital signs and FHR which are stable.

## 2012-01-08 NOTE — Anesthesia Preprocedure Evaluation (Signed)

## 2012-01-08 NOTE — Anesthesia Postprocedure Evaluation (Signed)
  Anesthesia Post-op Note  Patient: Kaitlin Hooper  Procedure(s) Performed: * No procedures listed *  Patient Location: Mother/Baby  Anesthesia Type: Epidural  Level of Consciousness: awake  Airway and Oxygen Therapy: Patient Spontanous Breathing  Post-op Pain: mild  Post-op Assessment: Patient's Cardiovascular Status Stable and Respiratory Function Stable  Post-op Vital Signs: stable  Complications: No apparent anesthesia complications

## 2012-01-09 LAB — CBC
HCT: 31.5 % — ABNORMAL LOW (ref 36.0–46.0)
MCH: 28.8 pg (ref 26.0–34.0)
MCHC: 32.7 g/dL (ref 30.0–36.0)
MCV: 88 fL (ref 78.0–100.0)
Platelets: 307 10*3/uL (ref 150–400)
RDW: 14.1 % (ref 11.5–15.5)

## 2012-01-09 MED ORDER — IBUPROFEN 600 MG PO TABS: 600.0000 mg | ORAL_TABLET | Freq: Four times a day (QID) | ORAL | Status: AC | PRN

## 2012-01-09 NOTE — Discharge Summary (Signed)
Physician Discharge Summary  Patient ID: Kaitlin Hooper MRN: [id number] DOB/AGE: 1980/10/13 31 y.o.  Admit date: 01/08/2012 Discharge date: 01/09/2012  Admission Diagnoses: term pg, active labor  Discharge Diagnoses: Principal Problem:  *NSVD (normal spontaneous vaginal delivery) Lactating Normal involution  Discharged Condition: stable  Hospital Course: active labor, SVD, periurethral lac, normal involution, husband plans vasectomy  Consults: None  Significant Diagnostic Studies: labs: Results for orders placed during the hospital encounter of 01/08/12 (from the past 72 hour(s))  OB RESULTS CONSOLE RPR     Status: Normal      Component Value Range Comment   RPR Nonreactive     OB RESULTS CONSOLE HIV ANTIBODY (ROUTINE TESTING)     Status: Normal      Component Value Range Comment   HIV Non-reactive     OB RESULTS CONSOLE RUBELLA ANTIBODY, IGM     Status: Normal      Component Value Range Comment   Rubella Immune     OB RESULTS CONSOLE HEPATITIS B SURFACE ANTIGEN     Status: Normal      Component Value Range Comment   Hepatitis B Surface Ag Negative     OB RESULTS CONSOLE ABO/RH     Status: Normal      Component Value Range Comment   RH Type  Positive      ABO Grouping O     OB RESULTS CONSOLE ANTIBODY SCREEN     Status: Normal      Component Value Range Comment   Antibody Screen Negative     CBC     Status: Abnormal   Collection Time   01/08/12  2:00 AM      Component Value Range Comment   WBC 14.9 (*) 4.0 - 10.5 (K/uL)    RBC 3.85 (*) 3.87 - 5.11 (MIL/uL)    Hemoglobin 10.9 (*) 12.0 - 15.0 (g/dL)    HCT 14.7 (*) 82.9 - 46.0 (%)    MCV 87.0  78.0 - 100.0 (fL)    MCH 28.3  26.0 - 34.0 (pg)    MCHC 32.5  30.0 - 36.0 (g/dL)    RDW 56.2  13.0 - 86.5 (%)    Platelets 410 (*) 150 - 400 (K/uL)   RPR     Status: Normal   Collection Time   01/08/12  2:00 AM      Component Value Range Comment   RPR NON REACTIVE  NON REACTIVE    CBC     Status: Abnormal   Collection  Time   01/09/12  5:00 AM      Component Value Range Comment   WBC 10.4  4.0 - 10.5 (K/uL)    RBC 3.58 (*) 3.87 - 5.11 (MIL/uL)    Hemoglobin 10.3 (*) 12.0 - 15.0 (g/dL)    HCT 78.4 (*) 69.6 - 46.0 (%)    MCV 88.0  78.0 - 100.0 (fL)    MCH 28.8  26.0 - 34.0 (pg)    MCHC 32.7  30.0 - 36.0 (g/dL)    RDW 29.5  28.4 - 13.2 (%)    Platelets 307  150 - 400 (K/uL)     Treatments: IV hydration  Discharge Exam: Blood pressure 101/68, pulse 82, temperature 97.4 F (36.3 C), temperature source Oral, resp. rate 20, height 5' 3.5" (1.613 m), last menstrual period 04/12/2011, SpO2 98.00%, unknown if currently breastfeeding. General appearance: alert, cooperative and no distress ff sm serosa flow, perineum clean intact, no edema, neg Homan's sign bilaterally  S just using motrin for cramping, little bleeding, breast well, husband plans vasectomy  Disposition: 01-Home or Self Care  Discharge Orders    Future Orders Please Complete By Expires   OB RESULTS CONSOLE RPR      Comments:   This external order was created through the Results Console.   OB RESULTS CONSOLE HIV antibody      Comments:   This external order was created through the Results Console.   OB RESULTS CONSOLE Rubella Antibody      Comments:   This external order was created through the Results Console.   OB RESULTS CONSOLE Hepatitis B surface antigen      Comments:   This external order was created through the Results Console.   OB RESULTS CONSOLE ABO/Rh      Comments:   This external order was created through the Results Console.   OB RESULTS CONSOLE Antibody Screen      Comments:   This external order was created through the Results Console.     Medication List  As of 01/09/2012 10:32 AM   TAKE these medications         ibuprofen 600 MG tablet   Commonly known as: ADVIL,MOTRIN   Take 1 tablet (600 mg total) by mouth every 6 (six) hours as needed for pain.      multivitamin with minerals tablet   Take 1 tablet by  mouth daily.           Follow-up Information    Follow up with [facility] in 6 weeks.        [facility] booklet discussed. SignedLavera Guise 01/09/2012, 10:32 AM

## 2012-01-09 NOTE — Discharge Instructions (Signed)
Vaginal Delivery Care After  Change your pad on each trip to the bathroom.   Wipe gently with toilet paper during your hospital stay. Always wipe from front to back. A spray bottle with warm tap water could also be used or a towelette if available.   Place your soiled pad and toilet paper in a bathroom wastebasket with a plastic bag liner.   During your hospital stay, save any clots. If you pass a clot while on the toilet, do not flush it. Also, if your vaginal flow seems excessive to you, notify nursing personnel.   The first time you get out of bed after delivery, wait for assistance from a nurse. Do not get up alone at any time if you feel weak or dizzy.   Bend and extend your ankles forcefully so that you feel the calves of your legs get hard. Do this 6 times every hour when you are in bed and awake.   Do not sit with one foot under you, dangle your legs over the edge of the bed, or maintain a position that hinders the circulation in your legs.   Many women experience after pains for 2 to 3 days after delivery. These after pains are mild uterine contractions. Ask the nurse for a pain medication if you need something for this. Sometimes breastfeeding stimulates after pains; if you find this to be true, ask for the medication  -  hour before the next feeding.   For you and your infant's protection, do not go beyond the door(s) of the obstetric unit. Do not carry your baby in your arms in the hallway. When taking your baby to and from your room, put your baby in the bassinet and push the bassinet.   Mothers may have their babies in their room as much as they desire.  Document Released: 08/07/2000 Document Revised: 07/30/2011 Document Reviewed: 07/08/2007 [employer] Patient Information 2012 [employer]. Breastfeeding BENEFITS OF BREASTFEEDING For the baby  The first milk (colostrum) helps the baby's digestive system function better.   There are antibodies from the mother in the milk  that help the baby fight off infections.   The baby has a lower incidence of asthma, allergies, and SIDS (sudden infant death syndrome).   The nutrients in breast milk are better than formulas for the baby and helps the baby's brain grow better.   Babies who breastfeed have less gas, colic, and constipation.  For the mother  Breastfeeding helps develop a very special bond between mother and baby.   It is more convenient, always available at the correct temperature and cheaper than formula feeding.   It burns calories in the mother and helps with losing weight that was gained during pregnancy.   It makes the uterus contract back down to normal size faster and slows bleeding following delivery.   Breastfeeding mothers have a lower risk of developing breast cancer.  NURSE FREQUENTLY  A healthy, full-term baby may breastfeed as often as every hour or space his or her feedings to every 3 hours.   How often to nurse will vary from baby to baby. Watch your baby for signs of hunger, not the clock.   Nurse as often as the baby requests, or when you feel the need to reduce the fullness of your breasts.   Awaken the baby if it has been 3 to 4 hours since the last feeding.   Frequent feeding will help the mother make more milk and will prevent problems like   sore nipples and engorgement of the breasts.  BABY'S POSITION AT THE BREAST  Whether lying down or sitting, be sure that the baby's tummy is facing your tummy.   Support the breast with 4 fingers underneath the breast and the thumb above. Make sure your fingers are well away from the nipple and baby's mouth.   Stroke the baby's lips and cheek closest to the breast gently with your finger or nipple.   When the baby's mouth is open wide enough, place all of your nipple and as much of the dark area around the nipple as possible into your baby's mouth.   Pull the baby in close so the tip of the nose and the baby's cheeks touch the breast  during the feeding.  FEEDINGS  The length of each feeding varies from baby to baby and from feeding to feeding.   The baby must suck about 2 to 3 minutes for your milk to get to him or her. This is called a "let down." For this reason, allow the baby to feed on each breast as long as he or she wants. Your baby will end the feeding when he or she has received the right balance of nutrients.   To break the suction, put your finger into the corner of the baby's mouth and slide it between his or her gums before removing your breast from his or her mouth. This will help prevent sore nipples.  REDUCING BREAST ENGORGEMENT  In the first week after your baby is born, you may experience signs of breast engorgement. When breasts are engorged, they feel heavy, warm, full, and may be tender to the touch. You can reduce engorgement if you:   Nurse frequently, every 2 to 3 hours. Mothers who breastfeed early and often have fewer problems with engorgement.   Place light ice packs on your breasts between feedings. This reduces swelling. Wrap the ice packs in a lightweight towel to protect your skin.   Apply moist hot packs to your breast for 5 to 10 minutes before each feeding. This increases circulation and helps the milk flow.   Gently massage your breast before and during the feeding.   Make sure that the baby empties at least one breast at every feeding before switching sides.   Use a breast pump to empty the breasts if your baby is sleepy or not nursing well. You may also want to pump if you are returning to work or or you feel you are getting engorged.   Avoid bottle feeds, pacifiers or supplemental feedings of water or juice in place of breastfeeding.   Be sure the baby is latched on and positioned properly while breastfeeding.   Prevent fatigue, stress, and anemia.   Wear a supportive bra, avoiding underwire styles.   Eat a balanced diet with enough fluids.  If you follow these suggestions,  your engorgement should improve in 24 to 48 hours. If you are still experiencing difficulty, call your lactation consultant or caregiver. IS MY BABY GETTING ENOUGH MILK? Sometimes, mothers worry about whether their babies are getting enough milk. You can be assured that your baby is getting enough milk if:  The baby is actively sucking and you hear swallowing.   The baby nurses at least 8 to 12 times in a 24 hour time period. Nurse your baby until he or she unlatches or falls asleep at the first breast (at least 10 to 20 minutes), then offer the second side.   The baby   is wetting 5 to 6 disposable diapers (6 to 8 cloth diapers) in a 24 hour period by 5 to 6 days of age.   The baby is having at least 2 to 3 stools every 24 hours for the first few months. Breast milk is all the food your baby needs. It is not necessary for your baby to have water or formula. In fact, to help your breasts make more milk, it is best not to give your baby supplemental feedings during the early weeks.   The stool should be soft and yellow.   The baby should gain 4 to 7 ounces per week after he is 4 days old.  TAKE CARE OF YOURSELF Take care of your breasts by:  Bathing or showering daily.   Avoiding the use of soaps on your nipples.   Start feedings on your left breast at one feeding and on your right breast at the next feeding.   You will notice an increase in your milk supply 2 to 5 days after delivery. You may feel some discomfort from engorgement, which makes your breasts very firm and often tender. Engorgement "peaks" out within 24 to 48 hours. In the meantime, apply warm moist towels to your breasts for 5 to 10 minutes before feeding. Gentle massage and expression of some milk before feeding will soften your breasts, making it easier for your baby to latch on. Wear a well fitting nursing bra and air dry your nipples for 10 to 15 minutes after each feeding.   Only use cotton bra pads.   Only use pure  lanolin on your nipples after nursing. You do not need to wash it off before nursing.  Take care of yourself by:   Eating well-balanced meals and nutritious snacks.   Drinking milk, fruit juice, and water to satisfy your thirst (about 8 glasses a day).   Getting plenty of rest.   Increasing calcium in your diet (1200 mg a day).   Avoiding foods that you notice affect the baby in a bad way.  SEEK MEDICAL CARE IF:   You have any questions or difficulty with breastfeeding.   You need help.   You have a hard, red, sore area on your breast, accompanied by a fever of 100.5 F (38.1 C) or more.   Your baby is too sleepy to eat well or is having trouble sleeping.   Your baby is wetting less than 6 diapers per day, by 5 days of age.   Your baby's skin or white part of his or her eyes is more yellow than it was in the hospital.   You feel depressed.  Document Released: 08/10/2005 Document Revised: 07/30/2011 Document Reviewed: 03/25/2009 [employer] Patient Information 2012 [employer]. 

## 2012-01-12 ENCOUNTER — Encounter: Payer: 59 | Admitting: Obstetrics and Gynecology

## 2012-02-08 ENCOUNTER — Other Ambulatory Visit: Payer: Self-pay | Admitting: Obstetrics and Gynecology

## 2012-02-08 NOTE — Telephone Encounter (Signed)
Triage/epic 

## 2012-02-09 ENCOUNTER — Other Ambulatory Visit: Payer: Self-pay

## 2012-02-09 MED ORDER — IBUPROFEN 600 MG PO TABS: 600.0000 mg | ORAL_TABLET | Freq: Four times a day (QID) | ORAL | Status: AC | PRN

## 2012-02-09 NOTE — Telephone Encounter (Signed)
Rx for Ibuprofen 600 mg #30 sent to [facility] on [address]. Pt notified and voiced understanding.

## 2012-02-18 ENCOUNTER — Ambulatory Visit (INDEPENDENT_AMBULATORY_CARE_PROVIDER_SITE_OTHER): Payer: Private Health Insurance - Indemnity | Admitting: Obstetrics and Gynecology

## 2012-02-18 ENCOUNTER — Encounter: Payer: Self-pay | Admitting: Obstetrics and Gynecology

## 2012-02-18 NOTE — Progress Notes (Signed)
Date of delivery: 01/08/12 Female Name: Kaitlin Hooper Vaginal delivery:yes Cesarean section:no Tubal ligation:no GDM:no Breast Feeding:yes Bottle Feeding:no Post-Partum Blues:no Abnormal pap:no Normal GU function: yes Normal GI function:yes but c/o hard stools  Returning to work:yes EPDS: 5  *Husband getting Vas next wk

## 2012-02-18 NOTE — Progress Notes (Signed)
Subjective:     Kaitlin Hooper is a 31 y.o. female who presents for a postpartum visit.  I have fully reviewed the prenatal and intrapartum course.    Patient is not sexually active.   The following portions of the patient's history were reviewed and updated as appropriate: allergies, current medications, past family history, past medical history, past social history, past surgical history and problem list.  Review of Systems Pertinent items are noted in HPI.   Objective:    BP 100/68  Resp 16  Wt 152 lb (68.947 kg)  Breastfeeding? Yes  General:  alert, cooperative and no distress     Lungs: clear to auscultation bilaterally  Heart:  regular rate and rhythm, S1, S2 normal, no murmur  Abdomen: soft, non-tender; bowel sounds normal; no masses,  no organomegaly   Vulva:  normal  Vagina: normal vagina  Cervix:  normal  Uterus: : normal size, contour, position, consistency, mobility, non-tender  Adnexa:  normal adnexa             Assessment:     Normal postpartum exam.  Pap smear not done at today's visit.  Due Feb 2014  Plan:    Malissa Hippo [facility] 02/18/2012 2:26 PM

## 2013-06-12 ENCOUNTER — Telehealth: Payer: Self-pay

## 2013-06-12 NOTE — Telephone Encounter (Addendum)
Medication and allergies: reviewed Currently not on any Rx medications  90 day supply/mail order: none Local pharmacy: [facility] [address]   Immunizations due: flu vaccine  A/P:   No family or surgical hx changes Has gyn--last PAP 01/2012 after birth of second child-results negative  To Discuss with Provider: Has fatty lump on left of back slightly below ribs

## 2013-06-13 ENCOUNTER — Ambulatory Visit (INDEPENDENT_AMBULATORY_CARE_PROVIDER_SITE_OTHER): Payer: Managed Care, Other (non HMO) | Admitting: Internal Medicine

## 2013-06-13 ENCOUNTER — Encounter: Payer: Self-pay | Admitting: Internal Medicine

## 2013-06-13 VITALS — BP 115/76 | HR 76 | Temp 98.3°F | Resp 16 | Ht 63.5 in | Wt 137.0 lb

## 2013-06-13 DIAGNOSIS — Z0000 Encounter for general adult medical examination without abnormal findings: Secondary | ICD-10-CM

## 2013-06-13 NOTE — Patient Instructions (Signed)
Preventive Health Care: Exercise  30-45  minutes a day, 3-4 days a week. Walking is especially valuable in preventing Osteoporosis. Eat a low-fat diet with lots of fruits and vegetables, up to 7-9 servings per day. This would eliminate need for vitamin supplements for most individuals. Consume less than 30 grams of sugar per day from foods & drinks with High Fructose Corn Syrup as # 1,2,3 or #4 on label.

## 2013-06-13 NOTE — Progress Notes (Signed)
  Subjective:    Patient ID: Kaitlin Hooper, female    DOB: [birth year], 32 y.o.   MRN: [id number]  HPI She is here for a physical;acute issues include new lesion L posterior thorax.     Review of Systems  She noted the lesion incidentally as she was palpating the posterior thorax several weeks ago. She denies associated fever, chills, sweats, or unexplained weight loss. She has no abnormal bruising or bleeding.  She has been anemic in the past but only in the context of her pregnancies in 2010 & 2013.     Objective:   Physical Exam  Gen.: Thin but healthy and well-nourished in appearance. Alert, appropriate and cooperative throughout exam.Appears younger than stated age  Head: Normocephalic without obvious abnormalities  Eyes: No corneal or conjunctival inflammation noted. Pupils equal round reactive to light and accommodation.  Extraocular motion intact.  Ears: External  ear exam reveals no significant lesions or deformities. Canals clear .TMs normal.  Nose: External nasal exam reveals no deformity or inflammation. Nasal mucosa are pink and moist. No lesions or exudates noted.  Mouth: Oral mucosa and oropharynx reveal no lesions or exudates. Teeth in good repair. Neck: No deformities, masses, or tenderness noted. Range of motion & Thyroid normal. Lungs: Normal respiratory effort; chest expands symmetrically. Lungs are clear to auscultation without rales, wheezes, or increased work of breathing. Heart: Normal rate and rhythm. Normal S1 and S2. No gallop, click, or rub. S4 w/o murmur. Abdomen: Bowel sounds normal; abdomen soft and nontender. No masses, organomegaly or hernias noted. Aorta palpable ; no AAA Genitalia: As per Gyn                                  Musculoskeletal/extremities: No deformity or scoliosis noted of  the thoracic or lumbar spine.  No clubbing, cyanosis, edema, or significant extremity  deformity noted. Range of motion normal .Tone & strength  Normal. Joints  normal . Nail health good. Able to lie down & sit up w/o help. Negative SLR bilaterally Vascular: Carotid, radial artery, dorsalis pedis and  posterior tibial pulses are full and equal. No bruits present. Neurologic: Alert and oriented x3. Deep tendon reflexes symmetrical and normal.        Skin: Intact without suspicious lesions or rashes. Small scattered tattoes. 2 x 2 cm soft, subcutaneous lesion left lateral posterior thorax which is mobile and which transilluminates Lymph: No cervical, axillary lymphadenopathy present. Psych: Mood and affect are normal. Normally interactive                                                                                        Assessment & Plan:  #1 comprehensive physical exam; no acute findings #2 lipoma  Plan: see Orders  & Recommendations

## 2013-06-16 ENCOUNTER — Other Ambulatory Visit (INDEPENDENT_AMBULATORY_CARE_PROVIDER_SITE_OTHER): Payer: Managed Care, Other (non HMO)

## 2013-06-16 DIAGNOSIS — Z0000 Encounter for general adult medical examination without abnormal findings: Secondary | ICD-10-CM

## 2013-06-16 LAB — HEPATIC FUNCTION PANEL
AST: 14 U/L (ref 0–37)
Total Bilirubin: 0.6 mg/dL (ref 0.3–1.2)
Total Protein: 7.2 g/dL (ref 6.0–8.3)

## 2013-06-16 LAB — LIPID PANEL
Cholesterol: 154 mg/dL (ref 0–200)
HDL: 52.9 mg/dL (ref >39.00)
LDL Cholesterol: 87 mg/dL (ref 0–99)
Triglycerides: 70 mg/dL (ref 0.0–149.0)
VLDL: 14 mg/dL (ref 0.0–40.0)

## 2013-06-16 LAB — BASIC METABOLIC PANEL
BUN: 13 mg/dL (ref 6–23)
Calcium: 9 mg/dL (ref 8.4–10.5)
Creatinine, Ser: 0.8 mg/dL (ref 0.4–1.2)
GFR: 83.92 mL/min (ref >60.00)
Glucose, Bld: 84 mg/dL (ref 70–99)
Potassium: 3.8 mEq/L (ref 3.5–5.1)

## 2013-06-16 LAB — CBC WITH DIFFERENTIAL/PLATELET
Basophils Absolute: 0 10*3/uL (ref 0.0–0.1)
HCT: 38.3 % (ref 36.0–46.0)
Lymphs Abs: 1.8 10*3/uL (ref 0.7–4.0)
Monocytes Relative: 7.7 % (ref 3.0–12.0)
Neutro Abs: 2.1 10*3/uL (ref 1.4–7.7)
Platelets: 325 10*3/uL (ref 150.0–400.0)
RDW: 13.4 % (ref 11.5–14.6)
WBC: 4.3 10*3/uL — ABNORMAL LOW (ref 4.5–10.5)

## 2013-06-29 ENCOUNTER — Other Ambulatory Visit: Payer: Self-pay

## 2013-07-17 ENCOUNTER — Encounter: Payer: Self-pay | Admitting: Internal Medicine

## 2013-10-08 ENCOUNTER — Encounter (HOSPITAL_COMMUNITY): Payer: Self-pay | Admitting: Emergency Medicine

## 2013-10-08 ENCOUNTER — Emergency Department (HOSPITAL_COMMUNITY)
Admission: EM | Admit: 2013-10-08 | Discharge: 2013-10-08 | Disposition: A | Discharge status: Home / Self Care | Payer: Managed Care, Other (non HMO) | Source: Home / Self Care

## 2013-10-08 DIAGNOSIS — F439 Reaction to severe stress, unspecified: Secondary | ICD-10-CM

## 2013-10-08 DIAGNOSIS — L509 Urticaria, unspecified: Secondary | ICD-10-CM

## 2013-10-08 DIAGNOSIS — Z639 Problem related to primary support group, unspecified: Secondary | ICD-10-CM

## 2013-10-08 MED ORDER — TRIAMCINOLONE ACETONIDE 40 MG/ML IJ SUSP
40.0000 mg | Freq: Once | INTRAMUSCULAR | Status: AC
  Administered 2013-10-08: 40 mg via INTRAMUSCULAR

## 2013-10-08 MED ORDER — PREDNISONE 20 MG PO TABS: ORAL_TABLET | ORAL | Status: DC

## 2013-10-08 MED ORDER — TRIAMCINOLONE ACETONIDE 40 MG/ML IJ SUSP
INTRAMUSCULAR | Status: AC
  Filled 2013-10-08: qty 1

## 2013-10-08 NOTE — ED Provider Notes (Signed)
CSN: [id number]     Arrival date & time 10/08/13  3762 History   First MD Initiated Contact with Patient 10/08/13 1051     No chief complaint on file.    (Consider location/radiation/quality/duration/timing/severity/associated sxs/prior Treatment) HPI Comments: 33 year old female developed as 3 days ago. They have been distributed in a generalized fashion. A mildly pruritic. She states she is unaware of any particular calls although she states that she has been under quite a bit of stress with her husband's illness and financial problems over the past 7 months. She denies current swelling although she did have swelling around her eyes 2 days ago. Denies problems with breathing or cough.   Past Medical History  Diagnosis Date  . History of chicken pox   . Hx: UTI (urinary tract infection) 2004    no PMH of recurrent UTIs  . Monilial vaginitis 2004  . History of dyspareunia 11/ 2007    secondary to hymenal band + excessive labia minora  . Allergy   . Anemia 2010 & 2013    with pregnancies   Past Surgical History  Procedure Laterality Date  . Wisdom tooth extraction    . 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 Vestibulitis  2008  . Vulvoplasty  2007  . G 3 p 2     Family History  Problem Relation Age of Onset  . Coronary artery disease Father     CBAG  . Hypertension Maternal Grandmother   . Alcohol abuse Maternal Grandfather   . Heart attack Paternal Grandmother     ? 65-70  . Heart attack Paternal Grandfather 20  . Diabetes Neg Hx   . Stroke Neg Hx    History  Substance Use Topics  . Smoking status: Never Smoker   . Smokeless tobacco: Never Used     Comment: < 1 year as teen  . Alcohol Use: No     Comment:  occasionally 2 X/ month   OB History   Grav Para Term Preterm Abortions TAB SAB Ect Mult Living   3 2 2  1  1   2      Review of Systems  Constitutional: Negative.   HENT: Negative.   Respiratory: Negative.   Cardiovascular: Negative.   Gastrointestinal:  Negative.   Skin: Positive for rash.  Neurological: Negative.       Allergies  Other  Home Medications   Current Outpatient Rx  Name  Route  Sig  Dispense  Refill  . Multiple Vitamins-Minerals (MULTIVITAMIN WITH MINERALS) tablet   Oral   Take 1 tablet by mouth daily.         . predniSONE (DELTASONE) 20 MG tablet      2 tabs po for 4 days, then 1 tab q d for 4 days.Take with food   12 tablet   0    BP 127/84  Pulse 90  Temp(Src) 98.5 F (36.9 C) (Oral)  Resp 14  SpO2 100% Physical Exam  Nursing note and vitals reviewed. Constitutional: She is oriented to person, place, and time. She appears well-developed and well-nourished. No distress.  HENT:  Mouth/Throat: Oropharynx is clear and moist. No oropharyngeal exudate.  No intraoral swelling or erythema. Airway widely patent voice is normal  Eyes: Conjunctivae and EOM are normal.  Neck: Normal range of motion. Neck supple.  Cardiovascular: Normal rate, regular rhythm and normal heart sounds.   Pulmonary/Chest: Effort normal and breath sounds normal. No respiratory distress. She has no wheezes. She has no rales.  Musculoskeletal: She exhibits no edema and no tenderness.  Lymphadenopathy:    She has no cervical adenopathy.  Neurological: She is alert and oriented to person, place, and time. She exhibits normal muscle tone.  Skin: Skin is warm and dry.  Irregularly shaped macular erythema cutaneous lesions to the extrmeties and torso, lesser to the face.  Psychiatric: She has a normal mood and affect.    ED Course  Procedures (including critical care time) Labs Review Labs Reviewed - No data to display Imaging Review No results found.    MDM   Final diagnoses:  Urticaria  Stress at home    Make sure you are taking one of the antihistamines we discuss. Start Prednisone tomorrow with food Kenalog 40 mg IM now.  Janne Napoleon, NP 10/08/13 1125

## 2013-10-08 NOTE — Discharge Instructions (Signed)
Hives Hives are itchy, red, swollen areas of the skin. They can vary in size and location on your body. Hives can come and go for hours or several days (acute hives) or for several weeks (chronic hives). Hives do not spread from person to person (noncontagious). They may get worse with scratching, exercise, and emotional stress. CAUSES   Allergic reaction to food, additives, or drugs.  Infections, including the common cold.  Illness, such as vasculitis, lupus, or thyroid disease.  Exposure to sunlight, heat, or cold.  Exercise.  Stress.  Contact with chemicals. SYMPTOMS   Red or white swollen patches on the skin. The patches may change size, shape, and location quickly and repeatedly.  Itching.  Swelling of the hands, feet, and face. This may occur if hives develop deeper in the skin. DIAGNOSIS  Your caregiver can usually tell what is wrong by performing a physical exam. Skin or blood tests may also be done to determine the cause of your hives. In some cases, the cause cannot be determined. TREATMENT  Mild cases usually get better with medicines such as antihistamines. Severe cases may require an emergency epinephrine injection. If the cause of your hives is known, treatment includes avoiding that trigger.  HOME CARE INSTRUCTIONS   Avoid causes that trigger your hives.  Take antihistamines as directed by your caregiver to reduce the severity of your hives. Non-sedating or low-sedating antihistamines are usually recommended. Do not drive while taking an antihistamine.  Take any other medicines prescribed for itching as directed by your caregiver.  Wear loose-fitting clothing.  Keep all follow-up appointments as directed by your caregiver. SEEK MEDICAL CARE IF:   You have persistent or severe itching that is not relieved with medicine.  You have painful or swollen joints. SEEK IMMEDIATE MEDICAL CARE IF:   You have a fever.  Your tongue or lips are swollen.  You have  trouble breathing or swallowing.  You feel tightness in the throat or chest.  You have abdominal pain. These problems may be the first sign of a life-threatening allergic reaction. Call your local emergency services (911 in [place] MAKE SURE YOU:   Understand these instructions.  Will watch your condition.  Will get help right away if you are not doing well or get worse. Document Released: 08/10/2005 Document Revised: 02/09/2012 Document Reviewed: 11/03/2011 [facility] Patient Information 2014 [place].  Stress Management Stress is a state of physical or mental tension that often results from changes in your life or normal routine. Some common causes of stress are:  Death of a loved one.  Injuries or severe illnesses.  Getting fired or changing jobs.  Moving into a new home. Other causes may be:  Sexual problems.  Business or financial losses.  Taking on a large debt.  Regular conflict with someone at home or at work.  Constant tiredness from lack of sleep. It is not just bad things that are stressful. It may be stressful to:  Win the lottery.  Get married.  Buy a new car. The amount of stress that can be easily tolerated varies from person to person. Changes generally cause stress, regardless of the types of change. Too much stress can affect your health. It may lead to physical or emotional problems. Too little stress (boredom) may also become stressful. SUGGESTIONS TO REDUCE STRESS:  Talk things over with your family and friends. It often is helpful to share your concerns and worries. If you feel your problem is serious, you may want to  get help from a professional counselor.  Consider your problems one at a time instead of lumping them all together. Trying to take care of everything at once may seem impossible. List all the things you need to do and then start with the most important one. Set a goal to accomplish 2 or 3 things each day. If you expect to do too  many in a single day you will naturally fail, causing you to feel even more stressed.  Do not use alcohol or drugs to relieve stress. Although you may feel better for a short time, they do not remove the problems that caused the stress. They can also be habit forming.  Exercise regularly - at least 3 times per week. Physical exercise can help to relieve that "uptight" feeling and will relax you.  The shortest distance between despair and hope is often a good night's sleep.  Go to bed and get up on time allowing yourself time for appointments without being rushed.  Take a short "time-out" period from any stressful situation that occurs during the day. Close your eyes and take some deep breaths. Starting with the muscles in your face, tense them, hold it for a few seconds, then relax. Repeat this with the muscles in your neck, shoulders, hand, stomach, back and legs.  Take good care of yourself. Eat a balanced diet and get plenty of rest.  Schedule time for having fun. Take a break from your daily routine to relax. HOME CARE INSTRUCTIONS   Call if you feel overwhelmed by your problems and feel you can no longer manage them on your own.  Return immediately if you feel like hurting yourself or someone else. Document Released: 02/03/2001 Document Revised: 11/02/2011 Document Reviewed: 04/04/2013 [facility] Patient Information 2014 [place], [place].

## 2013-10-08 NOTE — ED Notes (Signed)
Assessment per D. Mabe, NP 

## 2013-10-08 NOTE — ED Provider Notes (Signed)
Medical screening examination/treatment/procedure(s) were performed by resident physician or non-physician practitioner and as supervising physician I was immediately available for consultation/collaboration.   Pauline Good MD.   Billy Fischer, MD 10/08/13 [phone]

## 2014-01-01 ENCOUNTER — Other Ambulatory Visit: Payer: Self-pay | Admitting: Orthodontics and Dentofacial Orthopedics

## 2014-01-01 DIAGNOSIS — R9389 Abnormal findings on diagnostic imaging of other specified body structures: Secondary | ICD-10-CM

## 2014-01-05 ENCOUNTER — Other Ambulatory Visit: Payer: Managed Care, Other (non HMO)

## 2014-06-05 ENCOUNTER — Other Ambulatory Visit: Payer: Self-pay

## 2014-06-05 DIAGNOSIS — Z1231 Encounter for screening mammogram for malignant neoplasm of breast: Secondary | ICD-10-CM

## 2014-06-08 ENCOUNTER — Other Ambulatory Visit: Payer: Self-pay

## 2014-06-25 ENCOUNTER — Encounter (HOSPITAL_COMMUNITY): Payer: Self-pay | Admitting: Emergency Medicine

## 2014-06-29 ENCOUNTER — Ambulatory Visit
Admission: RE | Admit: 2014-06-29 | Discharge: 2014-06-29 | Disposition: A | Discharge status: Home / Self Care | Payer: Managed Care, Other (non HMO) | Source: Ambulatory Visit

## 2014-06-29 DIAGNOSIS — Z1231 Encounter for screening mammogram for malignant neoplasm of breast: Secondary | ICD-10-CM

## 2015-03-27 ENCOUNTER — Encounter: Payer: Self-pay | Admitting: Internal Medicine

## 2015-03-27 ENCOUNTER — Ambulatory Visit (INDEPENDENT_AMBULATORY_CARE_PROVIDER_SITE_OTHER): Payer: Managed Care, Other (non HMO) | Admitting: Internal Medicine

## 2015-03-27 VITALS — BP 100/74 | HR 97 | Temp 98.7°F | Resp 14 | Wt 136.0 lb

## 2015-03-27 DIAGNOSIS — J029 Acute pharyngitis, unspecified: Secondary | ICD-10-CM | POA: Diagnosis not present

## 2015-03-27 MED ORDER — AMOXICILLIN 500 MG PO CAPS: 500.0000 mg | ORAL_CAPSULE | Freq: Three times a day (TID) | ORAL | Status: DC

## 2015-03-27 NOTE — Patient Instructions (Signed)
Plain Mucinex (NOT D) for thick secretions ;force NON dairy fluids .   Nasal cleansing in the shower as discussed with lather of mild shampoo.After 10 seconds wash off lather while  exhaling through nostrils. Make sure that all residual soap is removed to prevent irritation.  Flonase OR Nasacort AQ 1 spray in each nostril twice a day as needed. Use the "crossover" technique into opposite nostril spraying toward opposite ear @ 45 degree angle, not straight up into nostril.  Plain Allegra (NOT D )  160 daily , Loratidine 10 mg , OR Zyrtec 10 mg @ bedtime  as needed for itchy eyes & sneezing. Zicam Melts or Zinc lozenges as per package label for sore throat .  Complementary options to boost immunity include  vitamin C 2000 mg daily; & Echinacea for 4-7 days.

## 2015-03-27 NOTE — Progress Notes (Signed)
Pre visit review using our clinic review tool, if applicable. No additional management support is needed unless otherwise documented below in the visit note. 

## 2015-03-27 NOTE — Progress Notes (Signed)
   Subjective:    Patient ID: Kaitlin Hooper, female    DOB: 1980/09/18, 34 y.o.   MRN: [id number]  HPI  Her symptoms began 03/24/15 in the evening as sore throat. On 8/1 she had myalgias and arthralgias and felt hot and then cold. This was associated with fatigue. The fatigue has improved somewhat.  Her 2 daughters have pinkeye. They were all exposed to ill family members at a family gathering.    Review of Systems She has no cough, sputum production, wheezing, or shortness of breath. Frontal headache, facial pain , nasal purulence, dental pain, sore throat , otic pain or otic discharge denied. No fever actually recorded.     Objective:   Physical Exam General appearance:Adequately nourished; no acute distress or increased work of breathing is present.    Lymphatic: No  lymphadenopathy about the head, neck, or axilla .  Eyes: No conjunctival inflammation or lid edema is present. There is no scleral icterus.Extraocular motion & vision  intact  Ears:  External ear exam shows no significant lesions or deformities.  Otoscopic examination reveals clear canals, tympanic membranes are intact bilaterally without bulging, retraction, inflammation or discharge.  Nose: Small jewelry post on the left. External nasal examination shows no deformity or inflammation. Nasal mucosa are pink and moist without lesions or exudates Rightward septal deviation.No obstruction to airflow.   Oral exam: Dental hygiene is good; lips and gums are healthy appearing.There is severe left posterior oropharyngeal erythema with exudate .  Neck:  No deformities, thyromegaly, masses, or tenderness noted.   Supple with full range of motion without pain.   Heart:  Normal rate and regular rhythm. S1 and S2 normal without gallop, murmur, click, rub or other extra sounds.   Lungs:Chest clear to auscultation; no wheezes, rhonchi,rales ,or rubs present.  Extremities:  No cyanosis, edema, or clubbing  noted    Skin: Warm  & dry w/o tenting or jaundice. No significant lesions or rash.         Assessment & Plan:  #1 pharyngitis with exudate on L Plan: See orders and recommendations

## 2015-03-28 ENCOUNTER — Telehealth: Payer: Self-pay | Admitting: *Deleted

## 2015-03-28 DIAGNOSIS — J029 Acute pharyngitis, unspecified: Secondary | ICD-10-CM

## 2015-03-28 NOTE — Telephone Encounter (Signed)
Pt states she saw md yesterday was dx with strep throat. Started on the amoxil has take 5 dose already, throat is actually feeling worse. She states that there are more white patches. Wanting to know is antibiotic is strong enough. What else she can do for throat irritation...Kaitlin Hooper

## 2015-03-29 NOTE — Telephone Encounter (Signed)
Notified pt with md response. She stated throat feels a lil better she will continue to take antibiotic she still have 5 days left. If throat is not better by Monday she will come in for labs...Johny Chess

## 2015-03-29 NOTE — Telephone Encounter (Signed)
Get beta strep & mono spot in lab  Dx: pharyngitis

## 2015-07-28 ENCOUNTER — Ambulatory Visit (INDEPENDENT_AMBULATORY_CARE_PROVIDER_SITE_OTHER): Payer: Managed Care, Other (non HMO) | Admitting: Physician Assistant

## 2015-07-28 VITALS — BP 102/74 | HR 88 | Temp 98.8°F | Resp 16 | Ht 64.0 in | Wt 142.0 lb

## 2015-07-28 DIAGNOSIS — J039 Acute tonsillitis, unspecified: Secondary | ICD-10-CM | POA: Diagnosis not present

## 2015-07-28 DIAGNOSIS — J029 Acute pharyngitis, unspecified: Secondary | ICD-10-CM | POA: Diagnosis not present

## 2015-07-28 LAB — POCT RAPID STREP A (OFFICE): Rapid Strep A Screen: NEGATIVE

## 2015-07-28 MED ORDER — AMOXICILLIN 875 MG PO TABS: 875.0000 mg | ORAL_TABLET | Freq: Two times a day (BID) | ORAL | Status: DC

## 2015-07-28 MED ORDER — IBUPROFEN 600 MG PO TABS: 600.0000 mg | ORAL_TABLET | Freq: Three times a day (TID) | ORAL | Status: DC | PRN

## 2015-07-28 MED ORDER — HYDROCODONE-HOMATROPINE 5-1.5 MG/5ML PO SYRP: 2.5000 mL | ORAL_SOLUTION | Freq: Every evening | ORAL | Status: DC | PRN

## 2015-07-28 NOTE — Progress Notes (Signed)
07/28/2015 11:41 AM   DOB: 01-09-1981 / MRN: [id number]  SUBJECTIVE:  Kaitlin Hooper is a 34 y.o. female presenting for for the evaluation of sore throat that started today.  Associated symptoms include no other symptoms today and she denies runny nose, congestion, fever, cough, difficulty breathing, headache and jaw pain. Treatments tried thus far include nothing. relief. She reports sick contacts. She feels this problem is getting worse.   She is allergic to other.   She  has a past medical history of History of chicken pox; UTI (urinary tract infection) (2004); Monilial vaginitis (2004); History of dyspareunia (11/ 2007); Allergy; and Anemia (2010 & 2013).    She  reports that she has never smoked. She has never used smokeless tobacco. She reports that she does not drink alcohol or use illicit drugs. She  has no sexual activity history on file. The patient  has past surgical history that includes Wisdom tooth extraction; Partial hymenectomy (06/2006); vestibulitis (2008); vulvoplasty (2007); and G 3 P 2.  Her family history includes Alcohol abuse in her maternal grandfather; Coronary artery disease in her father; Heart attack in her paternal grandmother; Heart attack (age of onset: 60) in her paternal grandfather; Hypertension in her maternal grandmother. There is no history of Diabetes or Stroke.  Review of Systems  Constitutional: Negative for fever, chills, malaise/fatigue and diaphoresis.  HENT: Positive for sore throat. Negative for congestion.   Respiratory: Negative for cough, hemoptysis, shortness of breath and wheezing.   Cardiovascular: Negative for chest pain.  Gastrointestinal: Negative for nausea.  Skin: Negative for rash.  Neurological: Negative for dizziness and weakness.  Endo/Heme/Allergies: Negative for polydipsia.    Problem list and medications reviewed and updated by myself where necessary, and exist elsewhere in the encounter.   OBJECTIVE:  BP 102/74 mmHg   Pulse 88  Temp(Src) 98.8 F (37.1 C)  Resp 16  Ht 5\' 4"  (1.626 m)  Wt 142 lb (64.411 kg)  BMI 24.36 kg/m2  SpO2 99%  LMP 07/07/2015 (Approximate)  Physical Exam  Constitutional: She is oriented to person, place, and time. She appears well-developed.  HENT:  Mouth/Throat:    Eyes: EOM are normal. Pupils are equal, round, and reactive to light.  Cardiovascular: Normal rate.   Pulmonary/Chest: Effort normal.  Abdominal: She exhibits no distension.  Musculoskeletal: Normal range of motion.  Neurological: She is alert and oriented to person, place, and time. No cranial nerve deficit.  Skin: Skin is warm and dry. She is not diaphoretic.  Psychiatric: She has a normal mood and affect.  Vitals reviewed.   Results for orders placed or performed in visit on 07/28/15 (from the past 48 hour(s))  POCT rapid strep A     Status: None   Collection Time: 07/28/15 11:31 AM  Result Value Ref Range   Rapid Strep A Screen Negative Negative    ASSESSMENT AND PLAN:  Kaitlin Hooper was seen today for sore throat.  Diagnoses and all orders for this visit:  Sore throat -     POCT rapid strep A -     Culture, Group A Strep -     HYDROcodone-homatropine (HYCODAN) 5-1.5 MG/5ML syrup; Take 2.5-5 mLs by mouth at bedtime as needed for cough (Do not operate heavy machinery while taking this medication.). -     ibuprofen (ADVIL,MOTRIN) 600 MG tablet; Take 1 tablet (600 mg total) by mouth every 8 (eight) hours as needed.  Exudative tonsillitis: Advise that patient wait for culture before initiating therapy. Her  symptoms are early this may be viral.   -     amoxicillin (AMOXIL) 875 MG tablet; Take 1 tablet (875 mg total) by mouth 2 (two) times daily.    The patient was advised to call or return to clinic if she does not see an improvement in symptoms or to seek the care of the closest emergency department if she worsens with the above plan.   Philis Fendt, MHS, PA-C [facility] 07/28/2015 11:41 AM

## 2015-07-30 LAB — CULTURE, GROUP A STREP: ORGANISM ID, BACTERIA: NORMAL

## 2015-07-31 NOTE — Addendum Note (Signed)
Addended by: Roselee Culver on: 07/31/2015 12:31 PM   Modules accepted: Miquel Dunn

## 2015-07-31 NOTE — Progress Notes (Signed)
Normal letter sent

## 2015-07-31 NOTE — Progress Notes (Signed)
  Medical screening examination/treatment/procedure(s) were performed by non-physician practitioner and as supervising physician I was immediately available for consultation/collaboration.     

## 2016-03-04 ENCOUNTER — Telehealth: Payer: Self-pay | Admitting: Family Medicine

## 2016-03-04 NOTE — Telephone Encounter (Signed)
Pt would like to trans care from Dr Linna Darner to Dr Birdie Riddle, please advise ok to schedule

## 2016-03-04 NOTE — Telephone Encounter (Signed)
Ok to schedule.

## 2016-03-05 NOTE — Telephone Encounter (Signed)
Left detailed message on pt's cell phone

## 2016-03-05 NOTE — Telephone Encounter (Signed)
This shouldn't be a problem as long as pt has no other concerns to discuss

## 2016-03-05 NOTE — Telephone Encounter (Signed)
Scheduled patient on 06/12/16 @ 10am.  Patient states she needs labs and biometrical screening done at appt as well.  Will it be ok for patient to have this done during this visit?

## 2016-03-05 NOTE — Telephone Encounter (Signed)
Called pt and LMOVM to return call.  °

## 2016-04-10 ENCOUNTER — Encounter (HOSPITAL_BASED_OUTPATIENT_CLINIC_OR_DEPARTMENT_OTHER): Payer: Self-pay | Admitting: *Deleted

## 2016-04-14 ENCOUNTER — Other Ambulatory Visit: Payer: Self-pay | Admitting: Plastic Surgery

## 2016-04-15 ENCOUNTER — Encounter (HOSPITAL_BASED_OUTPATIENT_CLINIC_OR_DEPARTMENT_OTHER): Payer: Self-pay | Admitting: Plastic Surgery

## 2016-04-15 ENCOUNTER — Ambulatory Visit (HOSPITAL_BASED_OUTPATIENT_CLINIC_OR_DEPARTMENT_OTHER): Payer: Managed Care, Other (non HMO) | Admitting: Anesthesiology

## 2016-04-15 ENCOUNTER — Encounter (HOSPITAL_BASED_OUTPATIENT_CLINIC_OR_DEPARTMENT_OTHER)
Admission: RE | Disposition: A | Discharge status: Home / Self Care | Payer: Self-pay | Source: Ambulatory Visit | Attending: Plastic Surgery

## 2016-04-15 ENCOUNTER — Ambulatory Visit (HOSPITAL_BASED_OUTPATIENT_CLINIC_OR_DEPARTMENT_OTHER)
Admission: RE | Admit: 2016-04-15 | Discharge: 2016-04-15 | Disposition: A | Discharge status: Home / Self Care | Payer: Managed Care, Other (non HMO) | Source: Ambulatory Visit | Attending: Plastic Surgery | Admitting: Plastic Surgery

## 2016-04-15 DIAGNOSIS — D171 Benign lipomatous neoplasm of skin and subcutaneous tissue of trunk: Secondary | ICD-10-CM | POA: Insufficient documentation

## 2016-04-15 HISTORY — PX: LIPOMA EXCISION: SHX5283

## 2016-04-15 SURGERY — EXCISION LIPOMA
Anesthesia: General | Site: Back

## 2016-04-15 MED ORDER — LIDOCAINE HCL (CARDIAC) 20 MG/ML IV SOLN
INTRAVENOUS | Status: DC | PRN
  Administered 2016-04-15: 30 mg via INTRAVENOUS

## 2016-04-15 MED ORDER — DEXAMETHASONE SODIUM PHOSPHATE 10 MG/ML IJ SOLN
INTRAMUSCULAR | Status: DC | PRN
  Administered 2016-04-15: 10 mg via INTRAVENOUS

## 2016-04-15 MED ORDER — PROPOFOL 10 MG/ML IV BOLUS
INTRAVENOUS | Status: AC
  Filled 2016-04-15: qty 20

## 2016-04-15 MED ORDER — FENTANYL CITRATE (PF) 100 MCG/2ML IJ SOLN: 25.0000 ug | INTRAMUSCULAR | Status: DC | PRN

## 2016-04-15 MED ORDER — CEFAZOLIN SODIUM-DEXTROSE 2-4 GM/100ML-% IV SOLN
INTRAVENOUS | Status: AC
  Filled 2016-04-15: qty 100

## 2016-04-15 MED ORDER — CHLORHEXIDINE GLUCONATE CLOTH 2 % EX PADS: 6.0000 | MEDICATED_PAD | Freq: Once | CUTANEOUS | Status: DC

## 2016-04-15 MED ORDER — PROPOFOL 10 MG/ML IV BOLUS
INTRAVENOUS | Status: DC | PRN
  Administered 2016-04-15: 200 mg via INTRAVENOUS

## 2016-04-15 MED ORDER — LACTATED RINGERS IV SOLN
INTRAVENOUS | Status: DC
  Administered 2016-04-15: 10 mL/h via INTRAVENOUS

## 2016-04-15 MED ORDER — PROMETHAZINE HCL 25 MG/ML IJ SOLN: 6.2500 mg | INTRAMUSCULAR | Status: DC | PRN

## 2016-04-15 MED ORDER — FENTANYL CITRATE (PF) 100 MCG/2ML IJ SOLN
INTRAMUSCULAR | Status: AC
  Filled 2016-04-15: qty 2

## 2016-04-15 MED ORDER — ONDANSETRON HCL 4 MG/2ML IJ SOLN
INTRAMUSCULAR | Status: DC | PRN
  Administered 2016-04-15: 4 mg via INTRAVENOUS

## 2016-04-15 MED ORDER — FENTANYL CITRATE (PF) 100 MCG/2ML IJ SOLN
INTRAMUSCULAR | Status: DC | PRN
  Administered 2016-04-15: 100 ug via INTRAVENOUS

## 2016-04-15 MED ORDER — CEFAZOLIN SODIUM-DEXTROSE 2-4 GM/100ML-% IV SOLN
2.0000 g | INTRAVENOUS | Status: AC
  Administered 2016-04-15: 2 g via INTRAVENOUS

## 2016-04-15 MED ORDER — BUPIVACAINE-EPINEPHRINE 0.25% -1:200000 IJ SOLN
INTRAMUSCULAR | Status: DC | PRN
  Administered 2016-04-15: 4.5 mL

## 2016-04-15 MED ORDER — MIDAZOLAM HCL 5 MG/5ML IJ SOLN
INTRAMUSCULAR | Status: DC | PRN
  Administered 2016-04-15: 2 mg via INTRAVENOUS

## 2016-04-15 MED ORDER — MIDAZOLAM HCL 2 MG/2ML IJ SOLN
INTRAMUSCULAR | Status: AC
  Filled 2016-04-15: qty 2

## 2016-04-15 MED ORDER — HYDROCODONE-ACETAMINOPHEN 5-325 MG PO TABS: 1.0000 | ORAL_TABLET | Freq: Four times a day (QID) | ORAL | 0 refills | Status: DC | PRN

## 2016-04-15 SURGICAL SUPPLY — 43 items
ADH SKN CLS APL DERMABOND .7 (GAUZE/BANDAGES/DRESSINGS) ×1
BLADE CLIPPER SURG (BLADE) IMPLANT
BLADE HEX COATED 2.75 (ELECTRODE) IMPLANT
BLADE SURG 15 STRL LF DISP TIS (BLADE) ×1 IMPLANT
BLADE SURG 15 STRL SS (BLADE) ×2
CANISTER SUCT 1200ML W/VALVE (MISCELLANEOUS) IMPLANT
CHLORAPREP W/TINT 26ML (MISCELLANEOUS) ×1 IMPLANT
COVER BACK TABLE 60X90IN (DRAPES) ×2 IMPLANT
COVER MAYO STAND STRL (DRAPES) ×2 IMPLANT
DERMABOND ADVANCED (GAUZE/BANDAGES/DRESSINGS) ×1
DERMABOND ADVANCED .7 DNX12 (GAUZE/BANDAGES/DRESSINGS) IMPLANT
DRAPE LAPAROTOMY 100X72 PEDS (DRAPES) IMPLANT
DRAPE LAPAROTOMY T 98X78 PEDS (DRAPES) ×1 IMPLANT
DRAPE U-SHAPE 76X120 STRL (DRAPES) IMPLANT
ELECT COATED BLADE 2.86 ST (ELECTRODE) IMPLANT
ELECT REM PT RETURN 9FT ADLT (ELECTROSURGICAL) ×2
ELECTRODE REM PT RTRN 9FT ADLT (ELECTROSURGICAL) IMPLANT
GLOVE BIO SURGEON STRL SZ 6.5 (GLOVE) ×2 IMPLANT
GLOVE SURG SS PI 7.0 STRL IVOR (GLOVE) ×1 IMPLANT
GOWN STRL REUS W/ TWL LRG LVL3 (GOWN DISPOSABLE) ×1 IMPLANT
GOWN STRL REUS W/TWL LRG LVL3 (GOWN DISPOSABLE) ×2
NDL HYPO 25X1 1.5 SAFETY (NEEDLE) IMPLANT
NEEDLE HYPO 25X1 1.5 SAFETY (NEEDLE) IMPLANT
NS IRRIG 1000ML POUR BTL (IV SOLUTION) IMPLANT
PACK BASIN DAY SURGERY FS (CUSTOM PROCEDURE TRAY) ×2 IMPLANT
PENCIL BUTTON HOLSTER BLD 10FT (ELECTRODE) ×1 IMPLANT
SPONGE GAUZE 2X2 8PLY STRL LF (GAUZE/BANDAGES/DRESSINGS) IMPLANT
SPONGE GAUZE 4X4 12PLY STER LF (GAUZE/BANDAGES/DRESSINGS) ×1 IMPLANT
STRIP CLOSURE SKIN 1/2X4 (GAUZE/BANDAGES/DRESSINGS) ×1 IMPLANT
SUCTION FRAZIER HANDLE 10FR (MISCELLANEOUS)
SUCTION TUBE FRAZIER 10FR DISP (MISCELLANEOUS) IMPLANT
SUT ETHILON 4 0 PS 2 18 (SUTURE) IMPLANT
SUT MNCRL AB 4-0 PS2 18 (SUTURE) ×1 IMPLANT
SUT MON AB 3-0 SH 27 (SUTURE) ×2
SUT MON AB 3-0 SH27 (SUTURE) IMPLANT
SUT MON AB 5-0 PS2 18 (SUTURE) ×2 IMPLANT
SYR BULB 3OZ (MISCELLANEOUS) ×1 IMPLANT
SYR CONTROL 10ML LL (SYRINGE) ×2 IMPLANT
TAPE CLOTH SURG 4X10 WHT LF (GAUZE/BANDAGES/DRESSINGS) ×1 IMPLANT
TOWEL OR 17X24 6PK STRL BLUE (TOWEL DISPOSABLE) ×2 IMPLANT
TRAY DSU PREP LF (CUSTOM PROCEDURE TRAY) ×2 IMPLANT
TUBE CONNECTING 20X1/4 (TUBING) ×1 IMPLANT
YANKAUER SUCT BULB TIP NO VENT (SUCTIONS) ×1 IMPLANT

## 2016-04-15 NOTE — Anesthesia Postprocedure Evaluation (Signed)
Anesthesia Post Note  Patient: Kaitlin Hooper  Procedure(s) Performed: Procedure(s): EXCISION OF BACK LIPOMA  Patient location during evaluation: PACU Anesthesia Type: General Level of consciousness: awake and alert Pain management: pain level controlled Vital Signs Assessment: post-procedure vital signs reviewed and stable Respiratory status: spontaneous breathing, nonlabored ventilation, respiratory function stable and patient connected to nasal cannula oxygen Cardiovascular status: blood pressure returned to baseline and stable Postop Assessment: no signs of nausea or vomiting Anesthetic complications: no    Last Vitals:  Vitals:   04/15/16 1000 04/15/16 1150  BP: 116/76 102/65  Pulse: 99 86  Resp: 16 17  Temp: 37 C 36.8 C    Last Pain:  Vitals:   04/15/16 1150  TempSrc:   PainSc: 0-No pain                 Zenaida Deed

## 2016-04-15 NOTE — H&P (Signed)
Kaitlin Hooper is an 35 y.o. female.   Chief Complaint: lipoma HPI: The patient is a 35 y.o. yrs old wf here for treatment of a mass on the left posterior flank area.  She states it has been there for over a year.  It has been getting bigger and now starting to be tender.  It is 4 x 6 cm in size and in the left flank area lateral aspect.  It is soft but no movable.  No other lesions noted.  She has not had this type of lesion before. Nothing makes it better and it is getting larger with time.  She is otherwise healthy and not had any recent illnesses.  There is no history of trauma in the area of the lesion.  It feels most like a lipoma.   Past Medical History:  Diagnosis Date  . Allergy   . Anemia 2010 & 2013   with pregnancies  . History of chicken pox   . History of dyspareunia 11/ 2007   secondary to hymenal band + excessive labia minora  . Hx: UTI (urinary tract infection) 2004   no PMH of recurrent UTIs  . Monilial vaginitis 2004    Past Surgical History:  Procedure Laterality Date  . G 3 P 2    . PARTIAL HYMENECTOMY  06/2006  . vestibulitis  2008  . vulvoplasty  2007  . WISDOM TOOTH EXTRACTION      Family History  Problem Relation Age of Onset  . Coronary artery disease Father     CBAG  . Hypertension Maternal Grandmother   . Alcohol abuse Maternal Grandfather   . Heart attack Paternal Grandmother     ? 65-70  . Heart attack Paternal Grandfather 40  . Diabetes Neg Hx   . Stroke Neg Hx    Social History:  reports that she has never smoked. She has never used smokeless tobacco. She reports that she drinks alcohol. She reports that she does not use drugs.  Allergies:  Allergies  Allergen Reactions  . Other     Sutures (monocry) from surgery for vaginoplasty .    No prescriptions prior to admission.    No results found for this or any previous visit (from the past 48 hour(s)). No results found.  Review of Systems  Constitutional: Negative.   HENT:  Negative.   Eyes: Negative.   Respiratory: Negative.   Cardiovascular: Negative.   Gastrointestinal: Negative.   Genitourinary: Negative.   Musculoskeletal: Positive for back pain.  Skin: Negative.   Neurological: Negative.   Psychiatric/Behavioral: Negative.     Height 5\' 3"  (1.6 m), weight 62.1 kg (137 lb), last menstrual period 03/19/2016. Physical Exam  Constitutional: She is oriented to person, place, and time. She appears well-developed and well-nourished.  HENT:  Head: Normocephalic and atraumatic.  Eyes: EOM are normal. Pupils are equal, round, and reactive to light.  Cardiovascular: Normal rate.   Respiratory: Effort normal. No respiratory distress.  GI: Soft. She exhibits no distension.  Neurological: She is alert and oriented to person, place, and time.  Skin: Skin is warm.  Psychiatric: She has a normal mood and affect. Her behavior is normal. Judgment and thought content normal.     Assessment/Plan Plan for excision and removal of lipoma of back.  [name], DO 04/15/2016, 7:53 AM

## 2016-04-15 NOTE — Brief Op Note (Signed)
04/15/2016  11:40 AM  PATIENT:  Kaitlin Hooper  35 y.o. female  PRE-OPERATIVE DIAGNOSIS:  back lipoma  POST-OPERATIVE DIAGNOSIS:  back lipoma  PROCEDURE:  Procedure(s): EXCISION OF BACK LIPOMA  SURGEON:  Surgeon(s) and Role:    * Marvelle Span S Brennan Karam, DO - Primary  PHYSICIAN ASSISTANT: Shawn Rayburn, PA  ASSISTANTS: none   ANESTHESIA:   general  EBL:  No intake/output data recorded.  BLOOD ADMINISTERED:none  DRAINS: none   LOCAL MEDICATIONS USED:  MARCAINE     SPECIMEN:  Source of Specimen:  lipoma of back  DISPOSITION OF SPECIMEN:  PATHOLOGY  COUNTS:  YES  TOURNIQUET:  * No tourniquets in log *  DICTATION: .Dragon Dictation  PLAN OF CARE: Discharge to home after PACU  PATIENT DISPOSITION:  PACU - hemodynamically stable.   Delay start of Pharmacological VTE agent (>24hrs) due to surgical blood loss or risk of bleeding: no

## 2016-04-15 NOTE — Anesthesia Preprocedure Evaluation (Signed)
Anesthesia Evaluation  Patient identified by MRN, date of birth, ID band Patient awake    Reviewed: Allergy & Precautions, H&P , NPO status , Patient's Chart, lab work & pertinent test results  History of Anesthesia Complications Negative for: history of anesthetic complications  Airway Mallampati: I  TM Distance: >3 FB Neck ROM: full    Dental no notable dental hx.    Pulmonary neg pulmonary ROS,    Pulmonary exam normal breath sounds clear to auscultation       Cardiovascular negative cardio ROS Normal cardiovascular exam Rhythm:regular Rate:Normal     Neuro/Psych negative neurological ROS     GI/Hepatic negative GI ROS, Neg liver ROS,   Endo/Other  negative endocrine ROS  Renal/GU negative Renal ROS     Musculoskeletal   Abdominal   Peds  Hematology   Anesthesia Other Findings   Reproductive/Obstetrics negative OB ROS                             Anesthesia Physical Anesthesia Plan  ASA: I  Anesthesia Plan: General   Post-op Pain Management:    Induction: Intravenous  Airway Management Planned: LMA  Additional Equipment:   Intra-op Plan:   Post-operative Plan: Extubation in OR  Informed Consent: I have reviewed the patients History and Physical, chart, labs and discussed the procedure including the risks, benefits and alternatives for the proposed anesthesia with the patient or authorized representative who has indicated his/her understanding and acceptance.   Dental Advisory Given  Plan Discussed with: Anesthesiologist, CRNA and Surgeon  Anesthesia Plan Comments:         Anesthesia Quick Evaluation

## 2016-04-15 NOTE — Discharge Instructions (Signed)
May shower Saturday May remove outer dressing for shower No heavy lifting.     Post Anesthesia Home Care Instructions  Activity: Get plenty of rest for the remainder of the day. A responsible adult should stay with you for 24 hours following the procedure.  For the next 24 hours, DO NOT: -Drive a car -Paediatric nurse -Drink alcoholic beverages -Take any medication unless instructed by your physician -Make any legal decisions or sign important papers.  Meals: Start with liquid foods such as gelatin or soup. Progress to regular foods as tolerated. Avoid greasy, spicy, heavy foods. If nausea and/or vomiting occur, drink only clear liquids until the nausea and/or vomiting subsides. Call your physician if vomiting continues.  Special Instructions/Symptoms: Your throat may feel dry or sore from the anesthesia or the breathing tube placed in your throat during surgery. If this causes discomfort, gargle with warm salt water. The discomfort should disappear within 24 hours.  If you had a scopolamine patch placed behind your ear for the management of post- operative nausea and/or vomiting:  1. The medication in the patch is effective for 72 hours, after which it should be removed.  Wrap patch in a tissue and discard in the trash. Wash hands thoroughly with soap and water. 2. You may remove the patch earlier than 72 hours if you experience unpleasant side effects which may include dry mouth, dizziness or visual disturbances. 3. Avoid touching the patch. Wash your hands with soap and water after contact with the patch.

## 2016-04-15 NOTE — Anesthesia Procedure Notes (Signed)
Procedure Name: LMA Insertion Date/Time: 04/15/2016 11:14 AM Performed by: Gilmer Mor R Pre-anesthesia Checklist: Patient identified, Emergency Drugs available, Suction available and Patient being monitored Patient Re-evaluated:Patient Re-evaluated prior to inductionOxygen Delivery Method: Circle system utilized Preoxygenation: Pre-oxygenation with 100% oxygen Intubation Type: IV induction Ventilation: Mask ventilation without difficulty LMA: LMA inserted LMA Size: 4.0 Number of attempts: 1 Placement Confirmation: positive ETCO2 and breath sounds checked- equal and bilateral Dental Injury: Teeth and Oropharynx as per pre-operative assessment

## 2016-04-15 NOTE — Interval H&P Note (Signed)
History and Physical Interval Note:  04/15/2016 9:53 AM  Kaitlin Hooper  has presented today for surgery, with the diagnosis of back lipoma  The various methods of treatment have been discussed with the patient and family. After consideration of risks, benefits and other options for treatment, the patient has consented to  Procedure(s): EXCISION OF BACK LIPOMA (N/A) as a surgical intervention .  The patient's history has been reviewed, patient examined, no change in status, stable for surgery.  I have reviewed the patient's chart and labs.  Questions were answered to the patient's satisfaction.     [name]

## 2016-04-15 NOTE — Transfer of Care (Signed)
Immediate Anesthesia Transfer of Care Note  Patient: Kaitlin Hooper  Procedure(s) Performed: Procedure(s): EXCISION OF BACK LIPOMA  Patient Location: PACU  Anesthesia Type:General  Level of Consciousness: awake, alert , oriented and patient cooperative  Airway & Oxygen Therapy: Patient Spontanous Breathing and Patient connected to face mask oxygen  Post-op Assessment: Report given to RN, Post -op Vital signs reviewed and stable and Patient moving all extremities X 4  Post vital signs: Reviewed and stable  Last Vitals:  Vitals:   04/15/16 1000 04/15/16 1150  BP: 116/76 102/65  Pulse: 99 86  Resp: 16 17  Temp: 37 C (P) 36.8 C    Last Pain:  Vitals:   04/15/16 1000  TempSrc: Oral         Complications: No apparent anesthesia complications

## 2016-04-15 NOTE — Op Note (Signed)
Operative Note   DATE OF OPERATION: 04/15/2016  LOCATION: [place]  SURGICAL DIVISION: Plastic Surgery  PREOPERATIVE DIAGNOSES:  Back lipoma  POSTOPERATIVE DIAGNOSES:  same  PROCEDURE:  Excision of back lipoma 5 x 6 cm with layered closure.  SURGEON: Reva Pinkley Sanger Danyel Tobey, DO  ASSISTANT: Shawn Rayburn, PA  ANESTHESIA:  General.   COMPLICATIONS: None.   INDICATIONS FOR PROCEDURE:  The patient, Kaitlin Hooper is a 35 y.o. female born on 18-Aug-1981, is here for treatment of a back lipoma MRN: [id number]  CONSENT:  Informed consent was obtained directly from the patient. Risks, benefits and alternatives were fully discussed. Specific risks including but not limited to bleeding, infection, hematoma, seroma, scarring, pain, infection, contracture, asymmetry, wound healing problems, and need for further surgery were all discussed. The patient did have an ample opportunity to have questions answered to satisfaction.   DESCRIPTION OF PROCEDURE:  The patient was taken to the operating room. SCDs were placed and IV antibiotics were given. The patient's operative site was prepped and draped in a sterile fashion. A time out was performed and all information was confirmed to be correct.  General anesthesia was administered.  The area was injected with local for intraoperative hemostasis and post operative pain control.  The skin was incised over the lipoma for 2 cm with a #10 blade.  The bovie was used to free the lipoma54 x 6 cm from the surrounding tissue.  It was resting on the muscle.  It came out nicely.  It was fatty looking in nature.  The pocket was irrigated and hemostasis achieved with electrocautery.  The deep layers were closed with 3-0 Monocryl.  The skin was closed with 4-0 Monocryl running subcuticular and then a vertical mattress suture.  dermabond and steri strips were applied.  The patient tolerated the procedure well.  There were no complications. The  patient was allowed to wake from anesthesia, extubated and taken to the recovery room in satisfactory condition.

## 2016-04-16 ENCOUNTER — Encounter (HOSPITAL_BASED_OUTPATIENT_CLINIC_OR_DEPARTMENT_OTHER): Payer: Self-pay | Admitting: Plastic Surgery

## 2016-05-14 ENCOUNTER — Other Ambulatory Visit: Payer: Self-pay | Admitting: Obstetrics and Gynecology

## 2016-05-14 ENCOUNTER — Telehealth: Payer: Self-pay | Admitting: Family Medicine

## 2016-05-14 ENCOUNTER — Other Ambulatory Visit: Payer: Self-pay | Admitting: Family Medicine

## 2016-05-14 DIAGNOSIS — Z1231 Encounter for screening mammogram for malignant neoplasm of breast: Secondary | ICD-10-CM

## 2016-05-14 NOTE — Telephone Encounter (Signed)
Patient notified of Dr. Virgil Benedict recommendations and is agreement and expresses an understanding.

## 2016-05-14 NOTE — Telephone Encounter (Signed)
If pt has no concerns, we can do a physical.  Any concerns and we would need to reschedule her physical.  Since she is a new patient, it is best to do the labs after she has been seen

## 2016-05-14 NOTE — Telephone Encounter (Signed)
Please advise 

## 2016-05-14 NOTE — Telephone Encounter (Signed)
Pt states that she is coming in for a cpe next week, looks like it a np appt. Pt states that she was told that she would have her CPE at this time and not have to do a NP appt. Pt asking if she could come in sooner for lab work due to having forms for ins that would need to be filled out. Please advise

## 2016-06-12 ENCOUNTER — Ambulatory Visit: Payer: Managed Care, Other (non HMO) | Admitting: Family Medicine

## 2016-06-19 ENCOUNTER — Ambulatory Visit
Admission: RE | Admit: 2016-06-19 | Discharge: 2016-06-19 | Disposition: A | Discharge status: Home / Self Care | Payer: Managed Care, Other (non HMO) | Source: Ambulatory Visit | Attending: Family Medicine | Admitting: Family Medicine

## 2016-06-19 DIAGNOSIS — Z1231 Encounter for screening mammogram for malignant neoplasm of breast: Secondary | ICD-10-CM

## 2016-10-14 LAB — HM PAP SMEAR

## 2016-10-28 ENCOUNTER — Encounter: Payer: Self-pay | Admitting: Family Medicine

## 2016-10-28 ENCOUNTER — Ambulatory Visit (INDEPENDENT_AMBULATORY_CARE_PROVIDER_SITE_OTHER): Payer: Commercial Managed Care - PPO | Admitting: Family Medicine

## 2016-10-28 VITALS — BP 121/81 | HR 80 | Temp 98.1°F | Resp 16 | Ht 63.0 in | Wt 145.2 lb

## 2016-10-28 DIAGNOSIS — E010 Iodine-deficiency related diffuse (endemic) goiter: Secondary | ICD-10-CM | POA: Diagnosis not present

## 2016-10-28 DIAGNOSIS — Z0000 Encounter for general adult medical examination without abnormal findings: Secondary | ICD-10-CM

## 2016-10-28 LAB — CBC WITH DIFFERENTIAL/PLATELET
BASOS ABS: 0 10*3/uL (ref 0.0–0.1)
Basophils Relative: 0.6 % (ref 0.0–3.0)
EOS ABS: 0.1 10*3/uL (ref 0.0–0.7)
Eosinophils Relative: 2 % (ref 0.0–5.0)
HCT: 39.7 % (ref 36.0–46.0)
Hemoglobin: 13.5 g/dL (ref 12.0–15.0)
LYMPHS ABS: 2.3 10*3/uL (ref 0.7–4.0)
Lymphocytes Relative: 38.2 % (ref 12.0–46.0)
MCHC: 34 g/dL (ref 30.0–36.0)
MCV: 91.6 fl (ref 78.0–100.0)
MONO ABS: 0.5 10*3/uL (ref 0.1–1.0)
Monocytes Relative: 8.5 % (ref 3.0–12.0)
NEUTROS PCT: 50.7 % (ref 43.0–77.0)
Neutro Abs: 3 10*3/uL (ref 1.4–7.7)
PLATELETS: 367 10*3/uL (ref 150.0–400.0)
RBC: 4.33 Mil/uL (ref 3.87–5.11)
RDW: 12.8 % (ref 11.5–15.5)
WBC: 5.9 10*3/uL (ref 4.0–10.5)

## 2016-10-28 LAB — LIPID PANEL
CHOLESTEROL: 190 mg/dL (ref 0–200)
HDL: 65.4 mg/dL (ref >39.00)
LDL Cholesterol: 95 mg/dL (ref 0–99)
NONHDL: 124.59
TRIGLYCERIDES: 150 mg/dL — AB (ref 0.0–149.0)
Total CHOL/HDL Ratio: 3
VLDL: 30 mg/dL (ref 0.0–40.0)

## 2016-10-28 LAB — BASIC METABOLIC PANEL
BUN: 16 mg/dL (ref 6–23)
CO2: 31 meq/L (ref 19–32)
CREATININE: 0.8 mg/dL (ref 0.40–1.20)
Calcium: 10 mg/dL (ref 8.4–10.5)
Chloride: 100 mEq/L (ref 96–112)
GFR: 85.87 mL/min (ref >60.00)
GLUCOSE: 101 mg/dL — AB (ref 70–99)
Potassium: 4.1 mEq/L (ref 3.5–5.1)
Sodium: 138 mEq/L (ref 135–145)

## 2016-10-28 LAB — HEPATIC FUNCTION PANEL
ALBUMIN: 4.5 g/dL (ref 3.5–5.2)
ALT: 9 U/L (ref 0–35)
AST: 13 U/L (ref 0–37)
Alkaline Phosphatase: 71 U/L (ref 39–117)
Bilirubin, Direct: 0.1 mg/dL (ref 0.0–0.3)
TOTAL PROTEIN: 7.4 g/dL (ref 6.0–8.3)
Total Bilirubin: 0.4 mg/dL (ref 0.2–1.2)

## 2016-10-28 LAB — VITAMIN D 25 HYDROXY (VIT D DEFICIENCY, FRACTURES): VITD: 32.36 ng/mL (ref 30.00–100.00)

## 2016-10-28 LAB — TSH: TSH: 1.24 u[IU]/mL (ref 0.35–4.50)

## 2016-10-28 NOTE — Assessment & Plan Note (Signed)
Pt's PE WNL w/ exception of thyromegaly ([place] ordered).  UTD on GYN, Tdap.  Check labs.  Anticipatory guidance provided.

## 2016-10-28 NOTE — Patient Instructions (Signed)
Follow up in 1 year or as needed We'll notify you of your lab results and make any changes if needed We'll call you with your ultrasound appointment to assess for thyroid nodules Keep up the good work on healthy diet and regular exercise- you look great! Call with any questions or concerns Welcome!  We're glad to have you!!!

## 2016-10-28 NOTE — Progress Notes (Signed)
   Subjective:    Patient ID: Kaitlin Hooper, female    DOB: [birth year], 36 y.o.   MRN: [id number]  HPI New to establish.  Previous MD- Hopper  GYN- Cira Servant  CPE- UTD on GYN.  UTD on Tdap.  No concerns today.   Review of Systems Patient reports no vision/ hearing changes, adenopathy,fever, weight change,  persistant/recurrent hoarseness , swallowing issues, chest pain, palpitations, edema, persistant/recurrent cough, hemoptysis, dyspnea (rest/exertional/paroxysmal nocturnal), gastrointestinal bleeding (melena, rectal bleeding), abdominal pain, significant heartburn, bowel changes, GU symptoms (dysuria, hematuria, incontinence), Gyn symptoms (abnormal  bleeding, pain),  syncope, focal weakness, memory loss, numbness & tingling, skin/hair/nail changes, abnormal bruising or bleeding, anxiety, or depression.     Objective:   Physical Exam General Appearance:    Alert, cooperative, no distress, appears stated age  Head:    Normocephalic, without obvious abnormality, atraumatic  Eyes:    PERRL, conjunctiva/corneas clear, EOM's intact, fundi    benign, both eyes  Ears:    Normal TM's and external ear canals, both ears  Nose:   Nares normal, septum midline, mucosa normal, no drainage    or sinus tenderness  Throat:   Lips, mucosa, and tongue normal; teeth and gums normal  Neck:   Supple, symmetrical, trachea midline, no adenopathy;    Thyroid: generalized fullness  Back:     Symmetric, no curvature, ROM normal, no CVA tenderness  Lungs:     Clear to auscultation bilaterally, respirations unlabored  Chest Wall:    No tenderness or deformity   Heart:    Regular rate and rhythm, S1 and S2 normal, no murmur, rub   or gallop  Breast Exam:    Deferred to GYN  Abdomen:     Soft, non-tender, bowel sounds active all four quadrants,    no masses, no organomegaly  Genitalia:    Deferred to GYN  Rectal:    Extremities:   Extremities normal, atraumatic, no cyanosis or edema  Pulses:   2+ and  symmetric all extremities  Skin:   Skin color, texture, turgor normal, no rashes or lesions  Lymph nodes:   Cervical, supraclavicular, and axillary nodes normal  Neurologic:   CNII-XII intact, normal strength, sensation and reflexes    throughout          Assessment & Plan:

## 2016-10-28 NOTE — Progress Notes (Signed)
Pre visit review using our clinic review tool, if applicable. No additional management support is needed unless otherwise documented below in the visit note. 

## 2016-11-12 ENCOUNTER — Ambulatory Visit
Admission: RE | Admit: 2016-11-12 | Discharge: 2016-11-12 | Disposition: A | Discharge status: Home / Self Care | Payer: Commercial Managed Care - PPO | Source: Ambulatory Visit | Attending: Family Medicine | Admitting: Family Medicine

## 2016-11-12 DIAGNOSIS — E010 Iodine-deficiency related diffuse (endemic) goiter: Secondary | ICD-10-CM

## 2018-01-11 ENCOUNTER — Encounter: Payer: Self-pay | Admitting: General Practice

## 2018-10-24 ENCOUNTER — Other Ambulatory Visit: Payer: Self-pay | Admitting: Obstetrics and Gynecology

## 2018-10-24 DIAGNOSIS — Z1231 Encounter for screening mammogram for malignant neoplasm of breast: Secondary | ICD-10-CM

## 2018-11-22 ENCOUNTER — Ambulatory Visit: Payer: Commercial Managed Care - PPO

## 2019-01-11 ENCOUNTER — Ambulatory Visit: Payer: Commercial Managed Care - PPO

## 2019-02-27 ENCOUNTER — Other Ambulatory Visit: Payer: Self-pay

## 2019-02-27 ENCOUNTER — Ambulatory Visit
Admission: RE | Admit: 2019-02-27 | Discharge: 2019-02-27 | Disposition: A | Discharge status: Home / Self Care | Payer: Commercial Managed Care - PPO | Source: Ambulatory Visit | Attending: Obstetrics and Gynecology | Admitting: Obstetrics and Gynecology

## 2019-02-27 DIAGNOSIS — Z1231 Encounter for screening mammogram for malignant neoplasm of breast: Secondary | ICD-10-CM

## 2020-03-29 ENCOUNTER — Other Ambulatory Visit: Payer: Self-pay | Admitting: Obstetrics and Gynecology

## 2020-03-29 DIAGNOSIS — Z1231 Encounter for screening mammogram for malignant neoplasm of breast: Secondary | ICD-10-CM

## 2020-04-09 ENCOUNTER — Ambulatory Visit: Payer: Commercial Managed Care - PPO

## 2020-04-18 ENCOUNTER — Ambulatory Visit
Admission: RE | Admit: 2020-04-18 | Discharge: 2020-04-18 | Disposition: A | Discharge status: Home / Self Care | Payer: Commercial Managed Care - PPO | Source: Ambulatory Visit | Attending: Obstetrics and Gynecology | Admitting: Obstetrics and Gynecology

## 2020-04-18 ENCOUNTER — Other Ambulatory Visit: Payer: Self-pay

## 2020-04-18 DIAGNOSIS — Z1231 Encounter for screening mammogram for malignant neoplasm of breast: Secondary | ICD-10-CM

## 2021-03-17 ENCOUNTER — Other Ambulatory Visit: Payer: Self-pay | Admitting: Obstetrics and Gynecology

## 2021-03-17 DIAGNOSIS — Z1231 Encounter for screening mammogram for malignant neoplasm of breast: Secondary | ICD-10-CM

## 2021-03-18 ENCOUNTER — Telehealth: Payer: Commercial Managed Care - PPO | Admitting: Physician Assistant

## 2021-03-18 DIAGNOSIS — U071 COVID-19: Secondary | ICD-10-CM | POA: Diagnosis not present

## 2021-03-18 MED ORDER — MOLNUPIRAVIR EUA 200MG CAPSULE: 4.0000 | ORAL_CAPSULE | Freq: Two times a day (BID) | ORAL | 0 refills | Status: AC

## 2021-03-18 MED ORDER — ALBUTEROL SULFATE HFA 108 (90 BASE) MCG/ACT IN AERS: 2.0000 | INHALATION_SPRAY | Freq: Four times a day (QID) | RESPIRATORY_TRACT | 0 refills | Status: AC | PRN

## 2021-03-18 NOTE — Progress Notes (Signed)
Virtual Visit Consent   Kaitlin Hooper, you are scheduled for a virtual visit with a [facility] provider today.     Just as with appointments in the office, your consent must be obtained to participate.  Your consent will be active for this visit and any virtual visit you may have with one of our providers in the next 365 days.     If you have a MyChart account, a copy of this consent can be sent to you electronically.  All virtual visits are billed to your insurance company just like a traditional visit in the office.    As this is a virtual visit, video technology does not allow for your provider to perform a traditional examination.  This may limit your provider's ability to fully assess your condition.  If your provider identifies any concerns that need to be evaluated in person or the need to arrange testing (such as labs, EKG, etc.), we will make arrangements to do so.     Although advances in technology are sophisticated, we cannot ensure that it will always work on either your end or our end.  If the connection with a video visit is poor, the visit may have to be switched to a telephone visit.  With either a video or telephone visit, we are not always able to ensure that we have a secure connection.     I need to obtain your verbal consent now.   Are you willing to proceed with your visit today?    Kaitlin Hooper has provided verbal consent on 03/18/2021 for a virtual visit (video or telephone).   Leeanne Rio, [place]   Date: 03/18/2021 12:55 PM   Virtual Visit via Video Note   I, Leeanne Rio, connected with  Kaitlin Hooper  ([id number], 1980/09/15) on 03/18/21 at 12:45 PM EDT by a video-enabled telemedicine application and verified that I am speaking with the correct person using two identifiers.  Location: Patient: Virtual Visit Location Patient: Home Provider: Virtual Visit Location Provider: Home Office   I discussed the limitations of evaluation and  management by telemedicine and the availability of in person appointments. The patient expressed understanding and agreed to proceed.    History of Present Illness: Kaitlin Hooper is a 40 y.o. who identifies as a female who was assigned female at birth, and is being seen today for COVID-19. Patient endorses her 40 year-old was diagnosed with COVID last week. Notes she has felt fine up until this morning. Notes waking up with fever (T max 101.7), aches, sore throat, sweats and chills. Has noted shortness of breath with exertion only and no chest pain. Has taken Ibuprofen which seems to help with fever.   HPI: HPI  Problems:  Patient Active Problem List   Diagnosis Date Noted   Physical exam 10/28/2016   ALLERGIC RHINITIS 07/07/2010    Allergies:  Allergies  Allergen Reactions   Other     Sutures (monocry) from surgery for vaginoplasty .   Medications:  Current Outpatient Medications:    albuterol (VENTOLIN HFA) 108 (90 Base) MCG/ACT inhaler, Inhale 2 puffs into the lungs every 6 (six) hours as needed for wheezing or shortness of breath., Disp: 8 g, Rfl: 0   molnupiravir EUA 200 mg CAPS, Take 4 capsules (800 mg total) by mouth 2 (two) times daily for 5 days., Disp: 40 capsule, Rfl: 0  Observations/Objective: Patient is well-developed, well-nourished in no acute distress.  Resting comfortably at home.  Head is normocephalic, atraumatic.  No labored breathing. Speech is clear and coherent with logical content.  Patient is alert and oriented at baseline.   Assessment and Plan: 1. COVID-19 - molnupiravir EUA 200 mg CAPS; Take 4 capsules (800 mg total) by mouth 2 (two) times daily for 5 days.  Dispense: 40 capsule; Refill: 0 - albuterol (VENTOLIN HFA) 108 (90 Base) MCG/ACT inhaler; Inhale 2 puffs into the lungs every 6 (six) hours as needed for wheezing or shortness of breath.  Dispense: 8 g; Refill: 0 Mild-moderate symptoms. Lower risk for complications but she and husband want to  discuss having antiviral in case of worsening symptoms. Discussed risks/benefits of antiviral medications including most common potential ADRs. Patient voiced understanding and would like to proceed with antiviral medication. They are candidate for molnupiravir. Rx sent to pharmacy. Supportive measures, OTC medications and vitamin regimen reviewed. Rx Albuterol to use as directed. Patient has been enrolled in a MyChart COVID symptom monitoring program. Samule Dry reviewed in detail. Strict ER precautions discussed with patient.    Follow Up Instructions: I discussed the assessment and treatment plan with the patient. The patient was provided an opportunity to ask questions and all were answered. The patient agreed with the plan and demonstrated an understanding of the instructions.  A copy of instructions were sent to the patient via MyChart.  The patient was advised to call back or seek an in-person evaluation if the symptoms worsen or if the condition fails to improve as anticipated.  Time:  I spent 15 minutes with the patient via telehealth technology discussing the above problems/concerns.    Leeanne Rio, PA-C

## 2021-03-18 NOTE — Progress Notes (Signed)
Patient desires Covid antiviral. Deferred to video  Based on the information that you have shared in the e-Visit Questionnaire, we recommend that you convert this visit to a video visit in order for the provider to better assess what is going on.  The provider will be able to give you a more accurate diagnosis and treatment plan if we can more freely discuss your symptoms and with the addition of a virtual examination.   If you convert to a video visit, we will bill your insurance (similar to an office visit) and you will not be charged for this e-Visit. You will be able to stay at home and speak with the first available [facility] advanced practice provider. The link to do a video visit is in the drop down Menu tab of your Welcome screen in [place].

## 2021-03-18 NOTE — Patient Instructions (Signed)
  Kaitlin Hooper, thank you for joining Leeanne Rio, PA-C for today's virtual visit.  While this provider is not your primary care provider (PCP), if your PCP is located in our provider database this encounter information will be shared with them immediately following your visit.  Consent: (Patient) Kaitlin Hooper provided verbal consent for this virtual visit at the beginning of the encounter.  Current Medications: No current outpatient medications on file.   Medications ordered in this encounter:  No orders of the defined types were placed in this encounter.    *If you need refills on other medications prior to your next appointment, please contact your pharmacy*  Follow-Up: Call back or seek an in-person evaluation if the symptoms worsen or if the condition fails to improve as anticipated.  Other Instructions Please keep well-hydrated and get plenty of rest. Start a saline nasal rinse to flush out your nasal passages. You can use plain Mucinex to help thin congestion. If you have a humidifier, running in the bedroom at night. I want you to start OTC vitamin D3 1000 units daily, vitamin C 1000 mg daily, and a zinc supplement. Please take prescribed medications as directed.  You have been enrolled in a MyChart symptom monitoring program. Please answer these questions daily so we can keep track of how you are doing.  You were to quarantine for 5 days from onset of your symptoms.  After day 5, if you have had no fever and you are feeling better, you can end quarantine but need to mask for an additional 5 days. After day 5 if you have a fever or are having significant symptoms, please quarantine for full 10 days.  If you note any worsening of symptoms, any significant shortness of breath or any chest pain, please seek ER evaluation ASAP.  Please do not delay care!    If you have been instructed to have an in-person evaluation today at a local Urgent Care facility,  please use the link below. It will take you to a list of all of our available [facility], including address, phone number and hours of operation. Please do not delay care.  [facility]  If you or a family member do not have a primary care provider, use the link below to schedule a visit and establish care. When you choose a [facility] primary care physician or advanced practice provider, you gain a long-term partner in health. Find a Primary Care Provider  Learn more about [facility]'s in-office and virtual care options: [facility]

## 2021-05-08 ENCOUNTER — Ambulatory Visit
Admission: RE | Admit: 2021-05-08 | Discharge: 2021-05-08 | Disposition: A | Discharge status: Home / Self Care | Payer: Commercial Managed Care - PPO | Source: Ambulatory Visit | Attending: Obstetrics and Gynecology | Admitting: Obstetrics and Gynecology

## 2021-05-08 DIAGNOSIS — Z1231 Encounter for screening mammogram for malignant neoplasm of breast: Secondary | ICD-10-CM

## 2021-08-20 NOTE — H&P (Signed)
Subjective:     Patient ID: Kaitlin Hooper is a 40 y.o. female.   HPI   Returns for follow up discussion breast reduction. She does not know current bra size, wears sports bras. Reports pain burning of shoulders neck and upper back for several month duration. Has tried stretching exercises, NSAIDs, chiropractor treatments for over 3 month trial without effect. Reports difficulty sleeping on abdomen due to breasts. Reports stopped carrying purse due to neck and shoulder pain. Denies numbness hands.    Wt stable over last year.   MMG 05/08/2021 normal PGM with history breast ca.   Prior patient of Dr. Marla Roe for lipoma removal.    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 Lives with spouse and daughter.   Review of Systems  Musculoskeletal: Positive for back pain, myalgias and neck pain.    Remainder 12 point review negative    Objective:   Physical Exam Cardiovascular:     Rate and Rhythm: Normal rate and regular rhythm.     Heart sounds: Normal heart sounds.  Pulmonary:     Effort: Pulmonary effort is normal.     Breath sounds: Normal breath sounds.  Skin:    Comments: Fitzpatrick 2     + shoulder grooving Breasts: no masses grade 3 ptosis bilateral SN to nipple R 28 L 30 cm BW R 20 L 20 cm Nipple to IMF R 10 L 10 cm      Assessment:     Macromastia Chronic neck and back pain    Plan:       Chronic neck and back and shoulder pain in setting of macromastia that has failed conservative management over 6 month trial. The pain is not related to other diagnoses.There is a reasonable likelihood that the patient's symptoms are primarily due to macromastia. Breast reduction is likely to result in improvement of the chronic pain.   Reviewed reduction with anchor type scars, OP surgery, drains, post operative visits and limitations, recovery. Diminished sensation nipple and breast skin, risk of nipple loss, wound healing problems, asymmetry, incidental carcinoma, changes with wt  gain/loss, aging, unacceptable cosmetic appearance reviewed. Reviewed scar maturation over months. Reviewed cannot assure cup size.   Additional risks including but not limited to bleeding, seroma, hematoma, damage to adjacent structures, need for additional procedures, infection, blood clots in legs or lungs reviewed.   Drain teaching completed. Rx for Norco given.   Anticipate 466 g reduction from each breast.

## 2021-08-28 ENCOUNTER — Encounter (HOSPITAL_BASED_OUTPATIENT_CLINIC_OR_DEPARTMENT_OTHER): Payer: Self-pay | Admitting: Plastic Surgery

## 2021-08-29 NOTE — Progress Notes (Signed)

## 2021-09-05 ENCOUNTER — Ambulatory Visit (HOSPITAL_BASED_OUTPATIENT_CLINIC_OR_DEPARTMENT_OTHER): Payer: Commercial Managed Care - PPO | Admitting: Anesthesiology

## 2021-09-05 ENCOUNTER — Other Ambulatory Visit: Payer: Self-pay

## 2021-09-05 ENCOUNTER — Encounter (HOSPITAL_BASED_OUTPATIENT_CLINIC_OR_DEPARTMENT_OTHER): Payer: Self-pay | Admitting: Plastic Surgery

## 2021-09-05 ENCOUNTER — Encounter (HOSPITAL_BASED_OUTPATIENT_CLINIC_OR_DEPARTMENT_OTHER)
Admission: RE | Disposition: A | Discharge status: Home / Self Care | Payer: Self-pay | Source: Ambulatory Visit | Attending: Plastic Surgery

## 2021-09-05 ENCOUNTER — Ambulatory Visit (HOSPITAL_BASED_OUTPATIENT_CLINIC_OR_DEPARTMENT_OTHER)
Admission: RE | Admit: 2021-09-05 | Discharge: 2021-09-05 | Disposition: A | Discharge status: Home / Self Care | Payer: Commercial Managed Care - PPO | Source: Ambulatory Visit | Attending: Plastic Surgery | Admitting: Plastic Surgery

## 2021-09-05 DIAGNOSIS — N62 Hypertrophy of breast: Secondary | ICD-10-CM | POA: Diagnosis present

## 2021-09-05 DIAGNOSIS — G8929 Other chronic pain: Secondary | ICD-10-CM | POA: Insufficient documentation

## 2021-09-05 DIAGNOSIS — M549 Dorsalgia, unspecified: Secondary | ICD-10-CM | POA: Diagnosis not present

## 2021-09-05 DIAGNOSIS — Z803 Family history of malignant neoplasm of breast: Secondary | ICD-10-CM | POA: Insufficient documentation

## 2021-09-05 DIAGNOSIS — N6032 Fibrosclerosis of left breast: Secondary | ICD-10-CM | POA: Diagnosis not present

## 2021-09-05 DIAGNOSIS — N6031 Fibrosclerosis of right breast: Secondary | ICD-10-CM | POA: Diagnosis not present

## 2021-09-05 DIAGNOSIS — M542 Cervicalgia: Secondary | ICD-10-CM | POA: Insufficient documentation

## 2021-09-05 HISTORY — PX: BREAST REDUCTION SURGERY: SHX8

## 2021-09-05 LAB — POCT PREGNANCY, URINE: Preg Test, Ur: NEGATIVE

## 2021-09-05 SURGERY — MAMMOPLASTY, REDUCTION
Anesthesia: General | Site: Breast | Laterality: Bilateral

## 2021-09-05 MED ORDER — CHLORHEXIDINE GLUCONATE CLOTH 2 % EX PADS: 6.0000 | MEDICATED_PAD | Freq: Once | CUTANEOUS | Status: DC

## 2021-09-05 MED ORDER — DEXAMETHASONE SODIUM PHOSPHATE 10 MG/ML IJ SOLN
INTRAMUSCULAR | Status: AC
  Filled 2021-09-05: qty 1

## 2021-09-05 MED ORDER — FENTANYL CITRATE (PF) 100 MCG/2ML IJ SOLN
INTRAMUSCULAR | Status: DC | PRN
  Administered 2021-09-05: 100 ug via INTRAVENOUS
  Administered 2021-09-05 (×3): 50 ug via INTRAVENOUS

## 2021-09-05 MED ORDER — LIDOCAINE 2% (20 MG/ML) 5 ML SYRINGE
INTRAMUSCULAR | Status: AC
  Filled 2021-09-05: qty 5

## 2021-09-05 MED ORDER — ONDANSETRON HCL 4 MG/2ML IJ SOLN
INTRAMUSCULAR | Status: DC | PRN
  Administered 2021-09-05: 4 mg via INTRAVENOUS

## 2021-09-05 MED ORDER — ROCURONIUM BROMIDE 10 MG/ML (PF) SYRINGE
PREFILLED_SYRINGE | INTRAVENOUS | Status: AC
  Filled 2021-09-05: qty 10

## 2021-09-05 MED ORDER — GABAPENTIN 300 MG PO CAPS
300.0000 mg | ORAL_CAPSULE | ORAL | Status: AC
  Administered 2021-09-05: 300 mg via ORAL

## 2021-09-05 MED ORDER — GABAPENTIN 300 MG PO CAPS
ORAL_CAPSULE | ORAL | Status: AC
  Filled 2021-09-05: qty 1

## 2021-09-05 MED ORDER — CEFAZOLIN SODIUM-DEXTROSE 2-4 GM/100ML-% IV SOLN
2.0000 g | INTRAVENOUS | Status: AC
  Administered 2021-09-05: 2 g via INTRAVENOUS

## 2021-09-05 MED ORDER — LACTATED RINGERS IV SOLN: INTRAVENOUS | Status: DC

## 2021-09-05 MED ORDER — MIDAZOLAM HCL 2 MG/2ML IJ SOLN
INTRAMUSCULAR | Status: AC
  Filled 2021-09-05: qty 2

## 2021-09-05 MED ORDER — FENTANYL CITRATE (PF) 100 MCG/2ML IJ SOLN
INTRAMUSCULAR | Status: AC
  Filled 2021-09-05: qty 2

## 2021-09-05 MED ORDER — FENTANYL CITRATE (PF) 100 MCG/2ML IJ SOLN: 25.0000 ug | INTRAMUSCULAR | Status: DC | PRN

## 2021-09-05 MED ORDER — PHENYLEPHRINE 40 MCG/ML (10ML) SYRINGE FOR IV PUSH (FOR BLOOD PRESSURE SUPPORT)
PREFILLED_SYRINGE | INTRAVENOUS | Status: AC
  Filled 2021-09-05: qty 10

## 2021-09-05 MED ORDER — ACETAMINOPHEN 500 MG PO TABS
1000.0000 mg | ORAL_TABLET | ORAL | Status: AC
  Administered 2021-09-05: 1000 mg via ORAL

## 2021-09-05 MED ORDER — CELECOXIB 200 MG PO CAPS
ORAL_CAPSULE | ORAL | Status: AC
  Filled 2021-09-05: qty 1

## 2021-09-05 MED ORDER — OXYCODONE HCL 5 MG PO TABS: 5.0000 mg | ORAL_TABLET | Freq: Once | ORAL | Status: DC | PRN

## 2021-09-05 MED ORDER — CELECOXIB 200 MG PO CAPS
200.0000 mg | ORAL_CAPSULE | ORAL | Status: AC
  Administered 2021-09-05: 200 mg via ORAL

## 2021-09-05 MED ORDER — ACETAMINOPHEN 500 MG PO TABS
ORAL_TABLET | ORAL | Status: AC
  Filled 2021-09-05: qty 2

## 2021-09-05 MED ORDER — ROCURONIUM BROMIDE 100 MG/10ML IV SOLN
INTRAVENOUS | Status: DC | PRN
  Administered 2021-09-05: 60 mg via INTRAVENOUS

## 2021-09-05 MED ORDER — BUPIVACAINE HCL (PF) 0.5 % IJ SOLN
INTRAMUSCULAR | Status: AC
  Filled 2021-09-05: qty 30

## 2021-09-05 MED ORDER — DEXAMETHASONE SODIUM PHOSPHATE 4 MG/ML IJ SOLN
INTRAMUSCULAR | Status: DC | PRN
Start: 2021-09-05 — End: 2021-09-05
  Administered 2021-09-05: 10 mg via INTRAVENOUS

## 2021-09-05 MED ORDER — CEFAZOLIN SODIUM-DEXTROSE 2-4 GM/100ML-% IV SOLN
INTRAVENOUS | Status: AC
  Filled 2021-09-05: qty 100

## 2021-09-05 MED ORDER — ATROPINE SULFATE 0.4 MG/ML IV SOLN
INTRAVENOUS | Status: AC
  Filled 2021-09-05: qty 1

## 2021-09-05 MED ORDER — BUPIVACAINE HCL (PF) 0.5 % IJ SOLN
INTRAMUSCULAR | Status: DC | PRN
  Administered 2021-09-05: 30 mL

## 2021-09-05 MED ORDER — MIDAZOLAM HCL 5 MG/5ML IJ SOLN
INTRAMUSCULAR | Status: DC | PRN
  Administered 2021-09-05: 2 mg via INTRAVENOUS

## 2021-09-05 MED ORDER — SUCCINYLCHOLINE CHLORIDE 200 MG/10ML IV SOSY
PREFILLED_SYRINGE | INTRAVENOUS | Status: AC
  Filled 2021-09-05: qty 10

## 2021-09-05 MED ORDER — SUGAMMADEX SODIUM 200 MG/2ML IV SOLN
INTRAVENOUS | Status: DC | PRN
  Administered 2021-09-05: 200 mg via INTRAVENOUS

## 2021-09-05 MED ORDER — EPHEDRINE 5 MG/ML INJ
INTRAVENOUS | Status: AC
  Filled 2021-09-05: qty 5

## 2021-09-05 MED ORDER — PROPOFOL 10 MG/ML IV BOLUS
INTRAVENOUS | Status: DC | PRN
Start: 2021-09-05 — End: 2021-09-05
  Administered 2021-09-05: 200 mg via INTRAVENOUS

## 2021-09-05 MED ORDER — 0.9 % SODIUM CHLORIDE (POUR BTL) OPTIME
TOPICAL | Status: DC | PRN
  Administered 2021-09-05: 200 mL

## 2021-09-05 MED ORDER — AMISULPRIDE (ANTIEMETIC) 5 MG/2ML IV SOLN: 10.0000 mg | Freq: Once | INTRAVENOUS | Status: DC | PRN

## 2021-09-05 MED ORDER — OXYCODONE HCL 5 MG/5ML PO SOLN
5.0000 mg | Freq: Once | ORAL | Status: DC | PRN
Start: 2021-09-05 — End: 2021-09-05

## 2021-09-05 MED ORDER — ONDANSETRON HCL 4 MG/2ML IJ SOLN
INTRAMUSCULAR | Status: AC
  Filled 2021-09-05: qty 2

## 2021-09-05 SURGICAL SUPPLY — 54 items
ADH SKN CLS APL DERMABOND .7 (GAUZE/BANDAGES/DRESSINGS) ×1
APL PRP STRL LF DISP 70% ISPRP (MISCELLANEOUS) ×2
BINDER BREAST 3XL (GAUZE/BANDAGES/DRESSINGS) IMPLANT
BINDER BREAST LRG (GAUZE/BANDAGES/DRESSINGS) IMPLANT
BINDER BREAST MEDIUM (GAUZE/BANDAGES/DRESSINGS) IMPLANT
BINDER BREAST XLRG (GAUZE/BANDAGES/DRESSINGS) ×1 IMPLANT
BINDER BREAST XXLRG (GAUZE/BANDAGES/DRESSINGS) IMPLANT
BLADE SURG 10 STRL SS (BLADE) ×10 IMPLANT
BNDG GAUZE ELAST 4 BULKY (GAUZE/BANDAGES/DRESSINGS) ×6 IMPLANT
CANISTER SUCT 1200ML W/VALVE (MISCELLANEOUS) ×3 IMPLANT
CHLORAPREP W/TINT 26 (MISCELLANEOUS) ×6 IMPLANT
COVER BACK TABLE 60X90IN (DRAPES) ×3 IMPLANT
COVER MAYO STAND STRL (DRAPES) ×3 IMPLANT
DERMABOND ADVANCED (GAUZE/BANDAGES/DRESSINGS) ×1
DERMABOND ADVANCED .7 DNX12 (GAUZE/BANDAGES/DRESSINGS) ×2 IMPLANT
DRAIN CHANNEL 15F RND FF W/TCR (WOUND CARE) ×2 IMPLANT
DRAPE TOP ARMCOVERS (MISCELLANEOUS) ×3 IMPLANT
DRAPE U-SHAPE 76X120 STRL (DRAPES) ×3 IMPLANT
DRAPE UTILITY XL STRL (DRAPES) ×3 IMPLANT
DRSG PAD ABDOMINAL 8X10 ST (GAUZE/BANDAGES/DRESSINGS) ×6 IMPLANT
ELECT COATED BLADE 2.86 ST (ELECTRODE) ×3 IMPLANT
ELECT REM PT RETURN 9FT ADLT (ELECTROSURGICAL) ×2
ELECTRODE REM PT RTRN 9FT ADLT (ELECTROSURGICAL) ×2 IMPLANT
EVACUATOR SILICONE 100CC (DRAIN) ×2 IMPLANT
GLOVE SURG HYDRASOFT LTX SZ5.5 (GLOVE) ×3 IMPLANT
GLOVE SURG POLYISO LF SZ6.5 (GLOVE) ×1 IMPLANT
GLOVE SURG POLYISO LF SZ7 (GLOVE) ×2 IMPLANT
GLOVE SURG UNDER POLY LF SZ6.5 (GLOVE) ×1 IMPLANT
GLOVE SURG UNDER POLY LF SZ7 (GLOVE) ×3 IMPLANT
GOWN STRL REUS W/ TWL LRG LVL3 (GOWN DISPOSABLE) ×4 IMPLANT
GOWN STRL REUS W/TWL LRG LVL3 (GOWN DISPOSABLE) ×8
MARKER SKIN DUAL TIP RULER LAB (MISCELLANEOUS) IMPLANT
NDL HYPO 25X1 1.5 SAFETY (NEEDLE) IMPLANT
NEEDLE HYPO 25X1 1.5 SAFETY (NEEDLE) ×2 IMPLANT
NS IRRIG 1000ML POUR BTL (IV SOLUTION) ×3 IMPLANT
PACK BASIN DAY SURGERY FS (CUSTOM PROCEDURE TRAY) ×3 IMPLANT
PENCIL SMOKE EVACUATOR (MISCELLANEOUS) ×3 IMPLANT
PIN SAFETY STERILE (MISCELLANEOUS) ×3 IMPLANT
SHEET MEDIUM DRAPE 40X70 STRL (DRAPES) ×5 IMPLANT
SLEEVE SCD COMPRESS KNEE MED (STOCKING) ×3 IMPLANT
SPONGE T-LAP 18X18 ~~LOC~~+RFID (SPONGE) ×10 IMPLANT
STAPLER VISISTAT 35W (STAPLE) ×3 IMPLANT
SUT ETHILON 2 0 FS 18 (SUTURE) ×3 IMPLANT
SUT MNCRL AB 4-0 PS2 18 (SUTURE) ×12 IMPLANT
SUT VIC AB 3-0 PS1 18 (SUTURE) ×12
SUT VIC AB 3-0 PS1 18XBRD (SUTURE) ×8 IMPLANT
SUT VICRYL 4-0 PS2 18IN ABS (SUTURE) ×6 IMPLANT
SYR BULB IRRIG 60ML STRL (SYRINGE) ×3 IMPLANT
SYR CONTROL 10ML LL (SYRINGE) ×1 IMPLANT
TAPE MEASURE VINYL STERILE (MISCELLANEOUS) IMPLANT
TOWEL GREEN STERILE FF (TOWEL DISPOSABLE) ×6 IMPLANT
TUBE CONNECTING 20X1/4 (TUBING) ×3 IMPLANT
UNDERPAD 30X36 HEAVY ABSORB (UNDERPADS AND DIAPERS) ×6 IMPLANT
YANKAUER SUCT BULB TIP NO VENT (SUCTIONS) ×3 IMPLANT

## 2021-09-05 NOTE — Anesthesia Procedure Notes (Signed)

## 2021-09-05 NOTE — Anesthesia Postprocedure Evaluation (Signed)
Anesthesia Post Note  Patient: Kaitlin Hooper  Procedure(s) Performed: MAMMARY REDUCTION  (BREAST) (Bilateral: Breast)     Patient location during evaluation: PACU Anesthesia Type: General Level of consciousness: awake and alert Pain management: pain level controlled Vital Signs Assessment: post-procedure vital signs reviewed and stable Respiratory status: spontaneous breathing, nonlabored ventilation, respiratory function stable and patient connected to nasal cannula oxygen Cardiovascular status: blood pressure returned to baseline and stable Postop Assessment: no apparent nausea or vomiting Anesthetic complications: no   No notable events documented.  Last Vitals:  Vitals:   09/05/21 1130 09/05/21 1145  BP: (!) 141/64 124/79  Pulse: (!) 106 (!) 104  Resp: 17 12  Temp:  36.9 C  SpO2: 96% 95%    Last Pain:  Vitals:   09/05/21 1145  TempSrc:   PainSc: 2                  Tiajuana Amass

## 2021-09-05 NOTE — Op Note (Signed)
Operative Note   DATE OF OPERATION: 1.13.23  LOCATION: [facility]-outpatient  SURGICAL DIVISION: Plastic Surgery  PREOPERATIVE DIAGNOSES:  1. Macromastia 2. Chronic neck and back pain  POSTOPERATIVE DIAGNOSES:  same  PROCEDURE:  Bilateral breast reduction  SURGEON: Irene Limbo MD MBA  ASSISTANT: none  ANESTHESIA:  General.   EBL: 45 ml  COMPLICATIONS: None immediate.   INDICATIONS FOR PROCEDURE:  The patient, Kaitlin Hooper, is a 41 y.o. female born on [birth year], is here for treatment chronic neck and back pain in setting of macromastia that has failed conservative measures.   FINDINGS: Right reduction 273 g Left reduction 316 g  DESCRIPTION OF PROCEDURE:  The patient was marked standing in the preoperative area to mark sternal notch, chest midline, anterior axillary lines, inframammary folds. The location of new nipple areolar complex was marked at level of on inframammary fold on anterior surface breast by palpation. This was marked symmetric over bilateral breasts. With aid of Wise pattern marker, location of new nipple areolar complex and vertical limbs (6 cm) were marked by displacement of breasts along meridian. The patient was taken to the operating room. SCDs were placed and IV antibiotics were given. The patient's operative site was prepped and draped in a sterile fashion. A time out was performed and all information was confirmed to be correct.     Over left breast, superior medial pedicle marked and nipple areolar complex incised with 42 mm diameter marker. Pedicle deepithlialized and developed to chest wall. Breast tissue resected over lower pole. Medial and lateral flaps developed. Additional lateral and superior breast tissue excised. Breast tailor tacked closed.    I then directed attention to right breast where superior medial pedicle designed. NAC marked with 42 mm diameter marker. The pedicle was deepithelialized. Pedicle developed to chest wall.  Breast tissue resected over lower pole. Medial and lateral flaps developed. Additional lateral and superior pole breast tissue excised. Breast tailor tacked closed, and patient brought to upright sitting position and assessed for symmetry. Patent returned to supine position. Breast cavities irrigated and hemostasis obtained. Local anesthetic infiltrated throughout each breast. 15 Fr JP placed in each breast and secured with 2-0 nylon. Closure completed bilateral with 3-0 vicryl to approximate dermis along inframammary fold and vertical limb. NAC inset with 4-0 vicryl in dermis. Skin closure completed with 4-0 monocryl subcuticular throughout. Tissue adhesive applied. Dry dressing and breast binder applied.   The patient was allowed to wake from anesthesia, extubated and taken to the recovery room in satisfactory condition.   SPECIMENS: right and left breast reduction   DRAINS: 15 Fr JP in right and left breast

## 2021-09-05 NOTE — Anesthesia Preprocedure Evaluation (Signed)
Anesthesia Evaluation  Patient identified by MRN, date of birth, ID band Patient awake    Reviewed: Allergy & Precautions, NPO status , Patient's Chart, lab work & pertinent test results  Airway Mallampati: II  TM Distance: >3 FB Neck ROM: Full    Dental  (+) Dental Advisory Given   Pulmonary neg pulmonary ROS,    breath sounds clear to auscultation       Cardiovascular negative cardio ROS   Rhythm:Regular Rate:Normal     Neuro/Psych negative neurological ROS     GI/Hepatic negative GI ROS, Neg liver ROS,   Endo/Other  negative endocrine ROS  Renal/GU negative Renal ROS     Musculoskeletal   Abdominal   Peds  Hematology negative hematology ROS (+)   Anesthesia Other Findings   Reproductive/Obstetrics                             Anesthesia Physical Anesthesia Plan  ASA: 1  Anesthesia Plan: General   Post-op Pain Management: Toradol IV (intra-op), Tylenol PO (pre-op), Gabapentin PO (pre-op) and Minimal or no pain anticipated   Induction:   PONV Risk Score and Plan: 3 and Midazolam, Dexamethasone, Ondansetron and Treatment may vary due to age or medical condition  Airway Management Planned: Oral ETT  Additional Equipment: None  Intra-op Plan:   Post-operative Plan: Extubation in OR  Informed Consent: I have reviewed the patients History and Physical, chart, labs and discussed the procedure including the risks, benefits and alternatives for the proposed anesthesia with the patient or authorized representative who has indicated his/her understanding and acceptance.     Dental advisory given  Plan Discussed with:   Anesthesia Plan Comments:         Anesthesia Quick Evaluation

## 2021-09-05 NOTE — Interval H&P Note (Signed)
History and Physical Interval Note:  09/05/2021 6:52 AM  Kaitlin Hooper  has presented today for surgery, with the diagnosis of macromastia, chronic neck and back pain.  The various methods of treatment have been discussed with the patient and family. After consideration of risks, benefits and other options for treatment, the patient has consented to  Procedure(s): MAMMARY REDUCTION  (BREAST) (Bilateral) as a surgical intervention.  The patient's history has been reviewed, patient examined, no change in status, stable for surgery.  I have reviewed the patient's chart and labs.  Questions were answered to the patient's satisfaction.     Arnoldo Hooker Enos Muhl

## 2021-09-05 NOTE — Addendum Note (Signed)
Addendum  created 09/05/21 1300 by Willa Frater, CRNA   Charge Capture section accepted

## 2021-09-05 NOTE — Discharge Instructions (Addendum)
Next tylenol dose after 12pm. Next ibuprofen dose after 2pm, Post Anesthesia Home Care Instructions  Activity: Get plenty of rest for the remainder of the day. A responsible individual must stay with you for 24 hours following the procedure.  For the next 24 hours, DO NOT: -Drive a car -Paediatric nurse -Drink alcoholic beverages -Take any medication unless instructed by your physician -Make any legal decisions or sign important papers.  Meals: Start with liquid foods such as gelatin or soup. Progress to regular foods as tolerated. Avoid greasy, spicy, heavy foods. If nausea and/or vomiting occur, drink only clear liquids until the nausea and/or vomiting subsides. Call your physician if vomiting continues.  Special Instructions/Symptoms: Your throat may feel dry or sore from the anesthesia or the breathing tube placed in your throat during surgery. If this causes discomfort, gargle with warm salt water. The discomfort should disappear within 24 hours.  If you had a scopolamine patch placed behind your ear for the management of post- operative nausea and/or vomiting:  1. The medication in the patch is effective for 72 hours, after which it should be removed.  Wrap patch in a tissue and discard in the trash. Wash hands thoroughly with soap and water. 2. You may remove the patch earlier than 72 hours if you experience unpleasant side effects which may include dry mouth, dizziness or visual disturbances. 3. Avoid touching the patch. Wash your hands with soap and water after contact with the patch.        JP Drain [employer] this sheet to all of your post-operative appointments while you have your drains. Please measure your drains by CC's or ML's. Make sure you drain and measure your JP Drains 2 or 3 times per day. At the end of each day, add up totals for the left side and add up totals for the right side.    ( 9 am )     ( 3 pm )        ( 9 pm )                Date L  R  L  R  L  R   Total L/R

## 2021-09-05 NOTE — Transfer of Care (Signed)
Immediate Anesthesia Transfer of Care Note  Patient: Kaitlin Hooper  Procedure(s) Performed: MAMMARY REDUCTION  (BREAST) (Bilateral: Breast)  Patient Location: PACU  Anesthesia Type:General  Level of Consciousness: awake, alert , oriented and patient cooperative  Airway & Oxygen Therapy: Patient Spontanous Breathing and Patient connected to face mask oxygen  Post-op Assessment: Report given to RN and Post -op Vital signs reviewed and stable  Post vital signs: Reviewed and stable  Last Vitals:  Vitals Value Taken Time  BP    Temp    Pulse    Resp    SpO2      Last Pain:  Vitals:   09/05/21 0644  TempSrc: Oral  PainSc: 0-No pain         Complications: No notable events documented.

## 2021-09-08 ENCOUNTER — Encounter (HOSPITAL_BASED_OUTPATIENT_CLINIC_OR_DEPARTMENT_OTHER): Payer: Self-pay | Admitting: Plastic Surgery

## 2021-09-09 LAB — SURGICAL PATHOLOGY

## 2022-01-13 ENCOUNTER — Other Ambulatory Visit: Payer: Self-pay | Admitting: Family Medicine

## 2022-01-13 DIAGNOSIS — E049 Nontoxic goiter, unspecified: Secondary | ICD-10-CM

## 2022-01-15 ENCOUNTER — Ambulatory Visit
Admission: RE | Admit: 2022-01-15 | Discharge: 2022-01-15 | Disposition: A | Discharge status: Home / Self Care | Payer: Commercial Managed Care - PPO | Source: Ambulatory Visit | Attending: Family Medicine | Admitting: Family Medicine

## 2022-01-15 DIAGNOSIS — E049 Nontoxic goiter, unspecified: Secondary | ICD-10-CM

## 2022-02-04 IMAGING — MG MM DIGITAL SCREENING BILAT W/ TOMO AND CAD
8 series · 8 of 24 positions shown · non-contrast
Comparison: Previous exam(s).

CLINICAL DATA: Screening.

EXAM:
DIGITAL SCREENING BILATERAL MAMMOGRAM WITH TOMOSYNTHESIS AND CAD
TECHNIQUE: Bilateral screening digital craniocaudal and mediolateral oblique
mammograms were obtained. Bilateral screening digital breast
tomosynthesis was performed. The images were evaluated with
computer-aided detection.

[R MLO synth-2D]
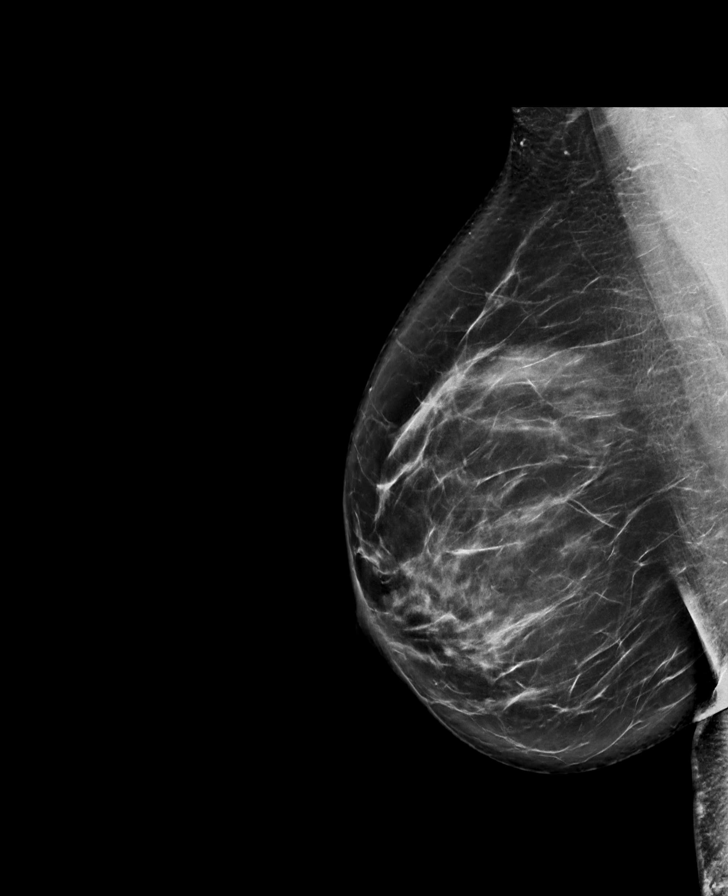

[L CC synth-2D]
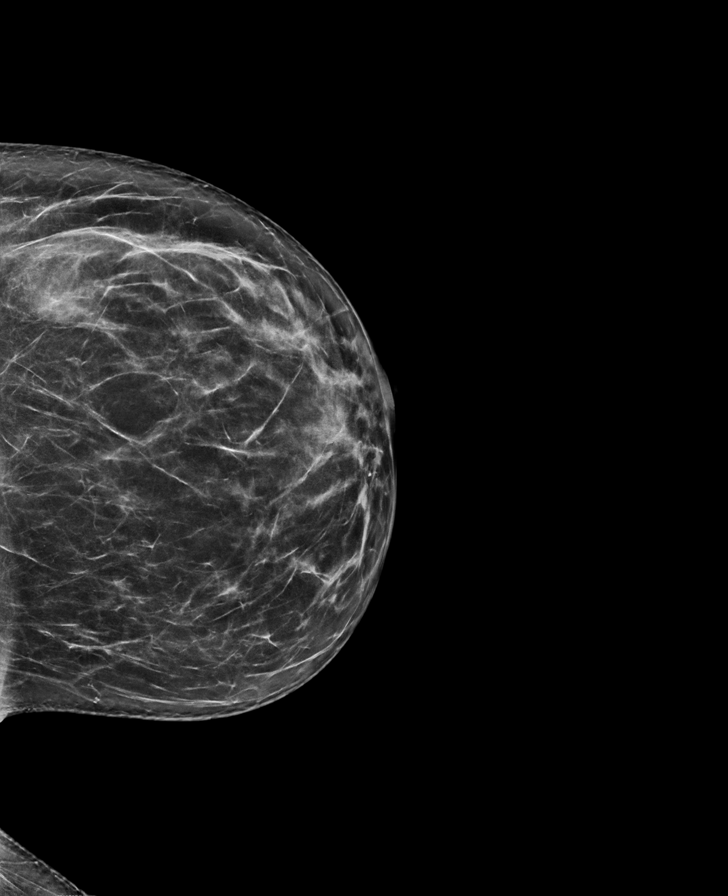

[L MLO synth-2D]
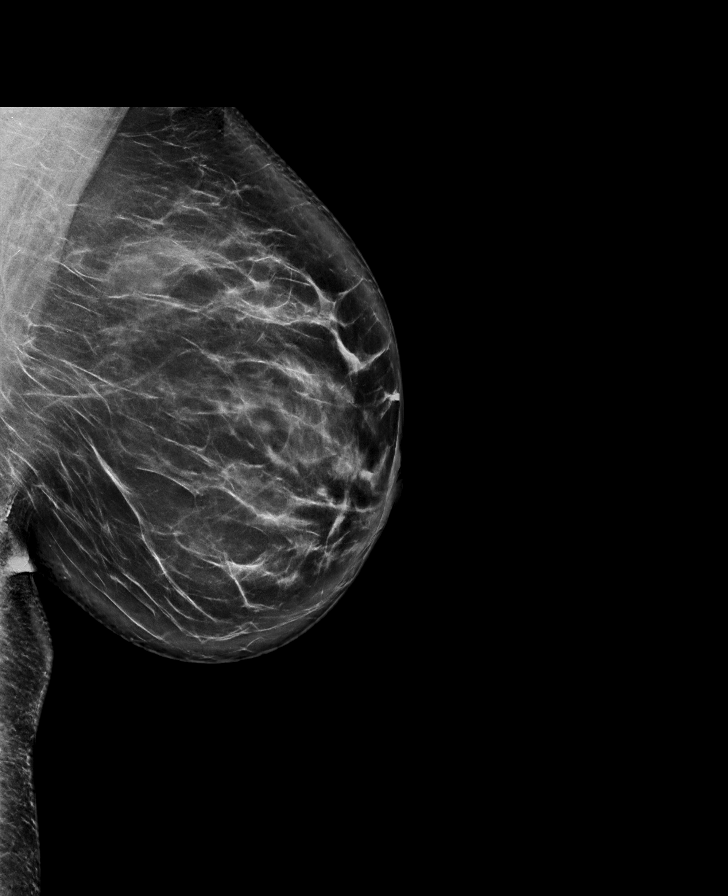

[R CC synth-2D]
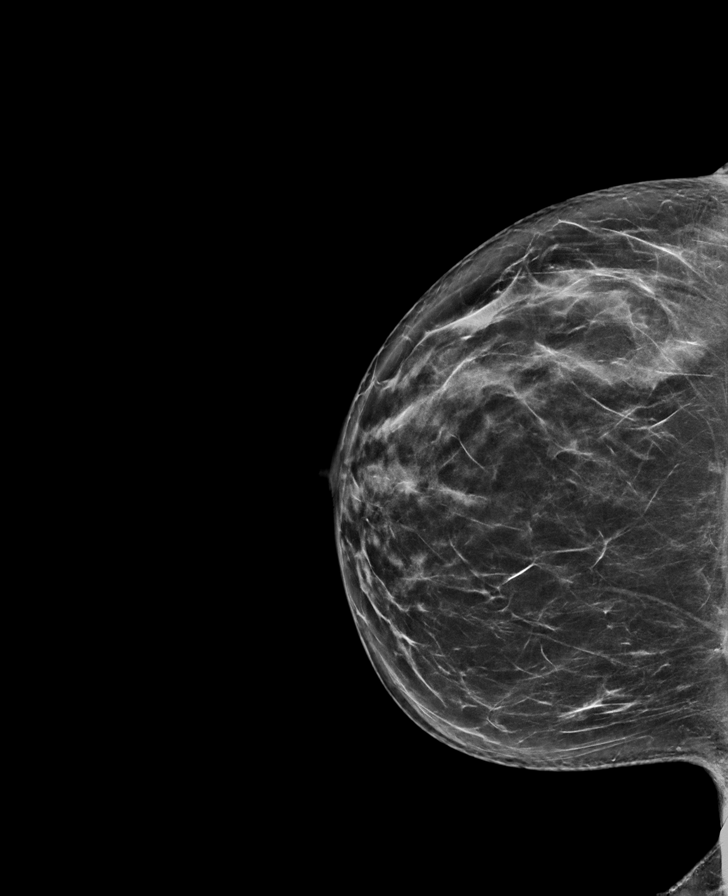

[R CC tomo · tomo slice 39/77.0]
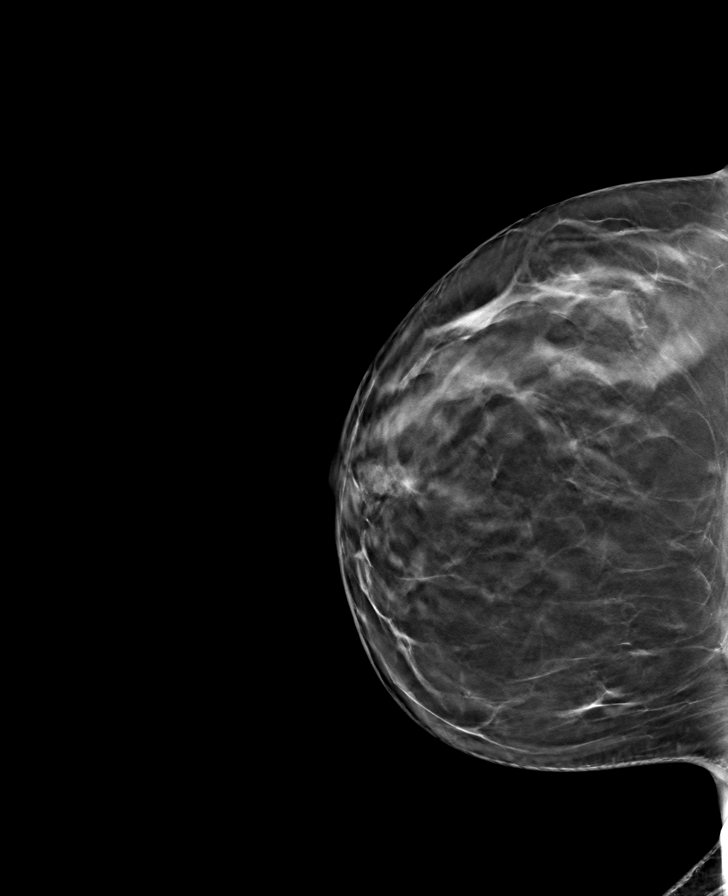

[L MLO tomo · tomo slice 47/94.0]
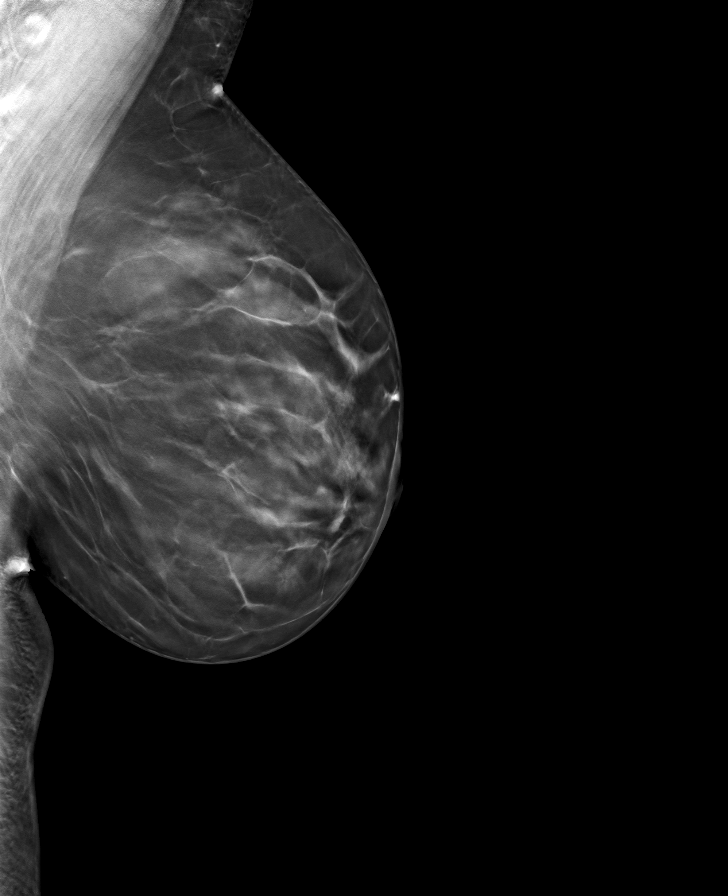

[R MLO tomo · tomo slice 49/96.0]
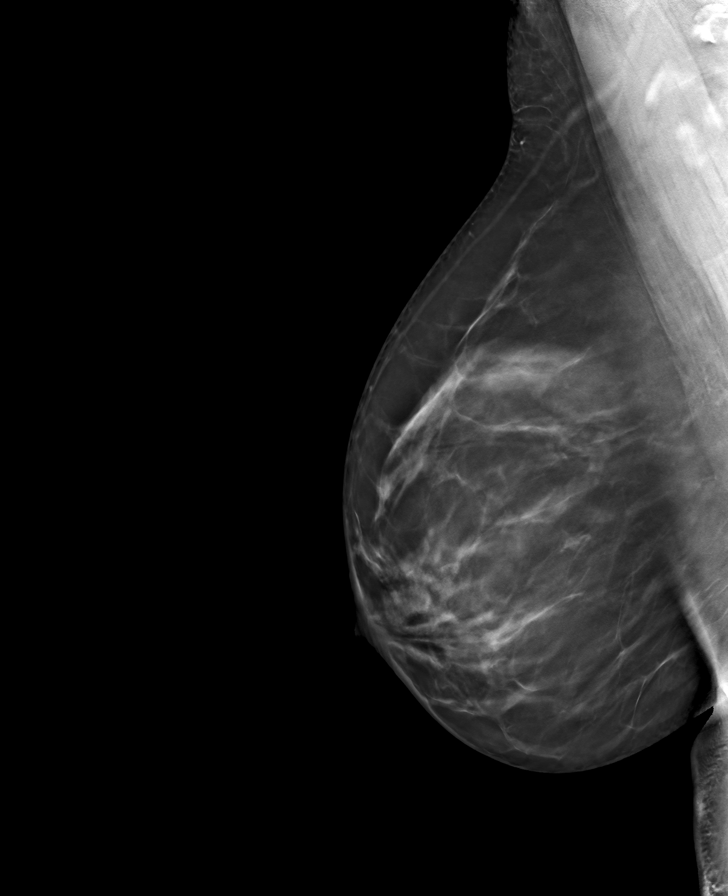

[L CC tomo · tomo slice 37/74.0]
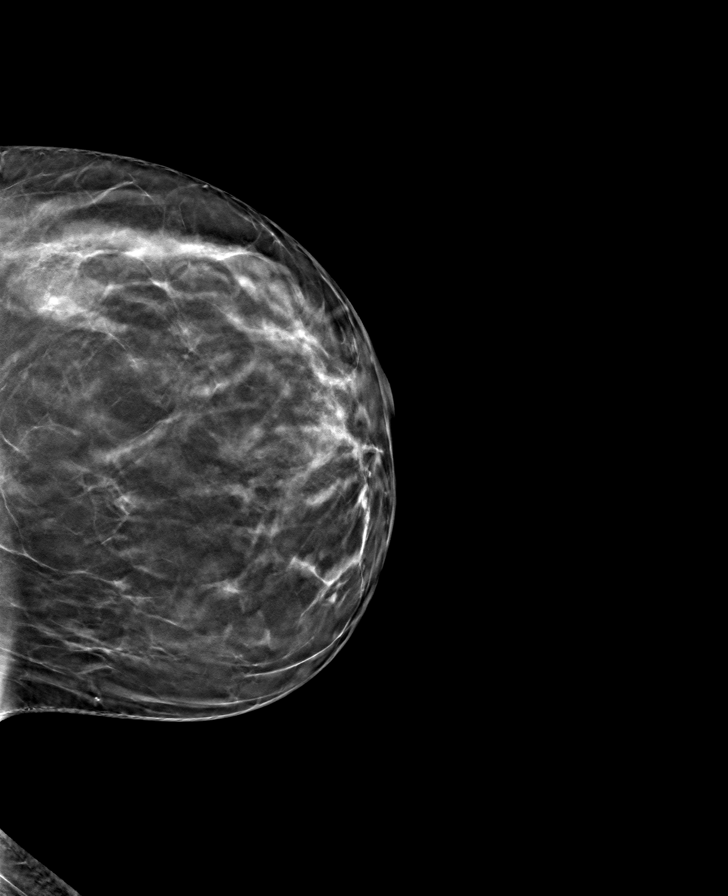

[8 of 24 positions shown; findings below may reference images not displayed]

ACR Breast Density Category c: The breast tissue is heterogeneously
dense, which may obscure small masses.
FINDINGS: There are no findings suspicious for malignancy.
IMPRESSION: No mammographic evidence of malignancy. A result letter of this
screening mammogram will be mailed directly to the patient.

RECOMMENDATION:
Screening mammogram in one year. (Code:Q3-W-BC3)

BI-RADS CATEGORY  1: Negative.

## 2022-04-02 ENCOUNTER — Other Ambulatory Visit: Payer: Self-pay | Admitting: Obstetrics and Gynecology

## 2022-04-02 DIAGNOSIS — Z1231 Encounter for screening mammogram for malignant neoplasm of breast: Secondary | ICD-10-CM

## 2022-05-11 ENCOUNTER — Ambulatory Visit: Payer: Commercial Managed Care - PPO

## 2022-07-23 ENCOUNTER — Ambulatory Visit
Admission: RE | Admit: 2022-07-23 | Discharge: 2022-07-23 | Disposition: A | Discharge status: Home / Self Care | Payer: Commercial Managed Care - PPO | Source: Ambulatory Visit | Attending: Obstetrics and Gynecology | Admitting: Obstetrics and Gynecology

## 2022-07-23 DIAGNOSIS — Z1231 Encounter for screening mammogram for malignant neoplasm of breast: Secondary | ICD-10-CM

## 2022-10-14 IMAGING — US US THYROID
1 series · 13 of 25 positions shown · non-contrast
Comparison: 11/12/2016

CLINICAL DATA: Palpable abnormality. Thyromegaly on physical
examination.

EXAM:
THYROID ULTRASOUND
TECHNIQUE: Ultrasound examination of the thyroid gland and adjacent soft
tissues was performed.

[Series 1: us thyroid · 0.05mm/px · 13 of 40 slices shown]
[im 1/40]
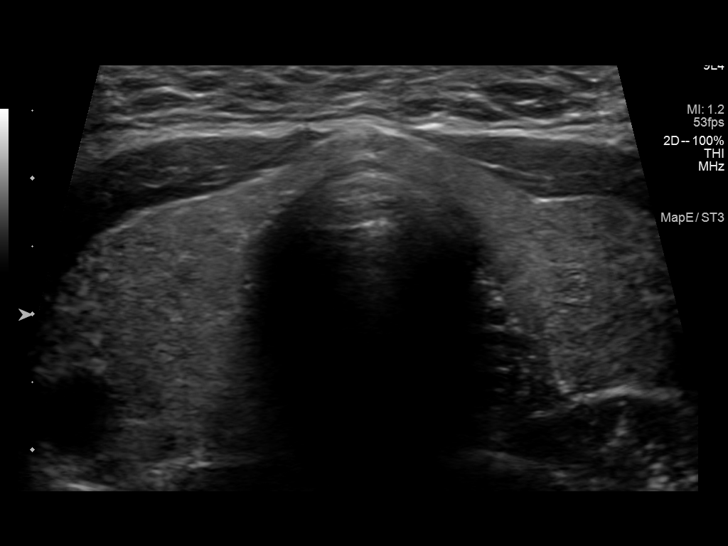
[im 4/40]
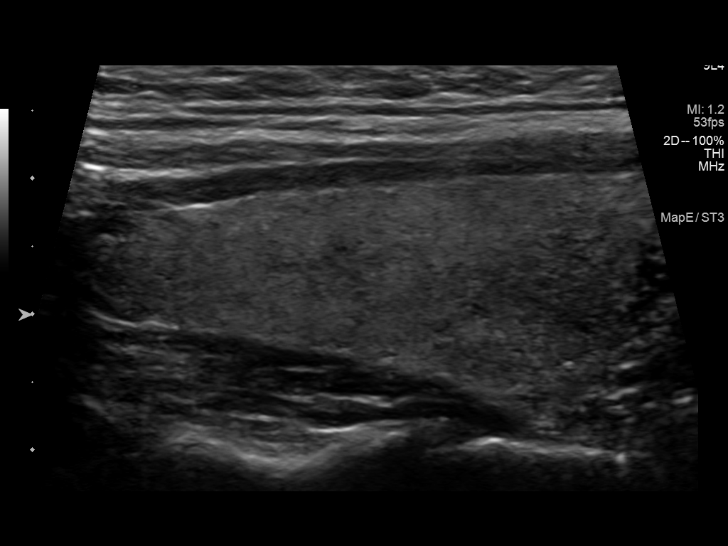
[im 7/40]
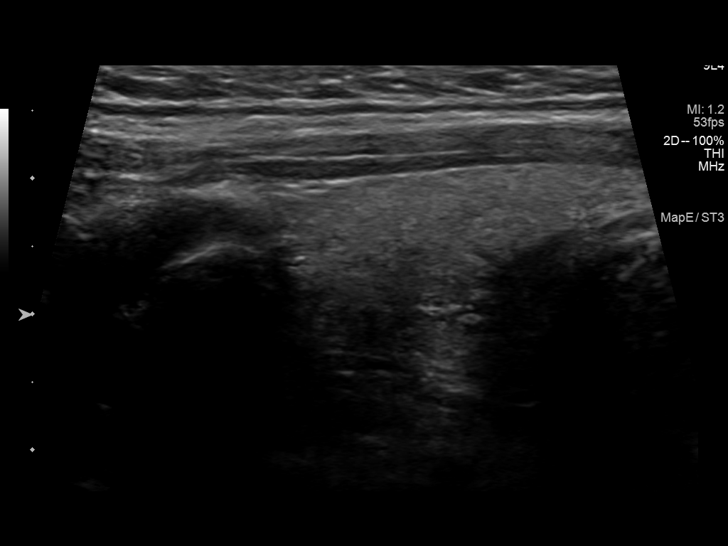
[im 10/40]
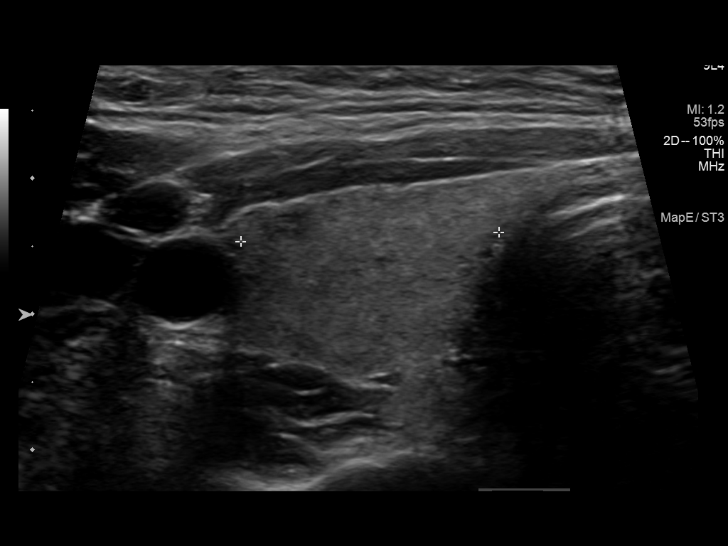
[im 14/40]
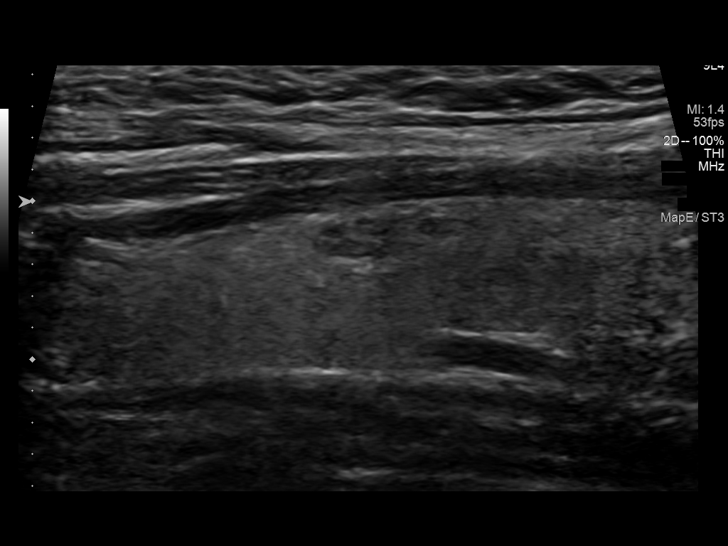
[im 17/40]
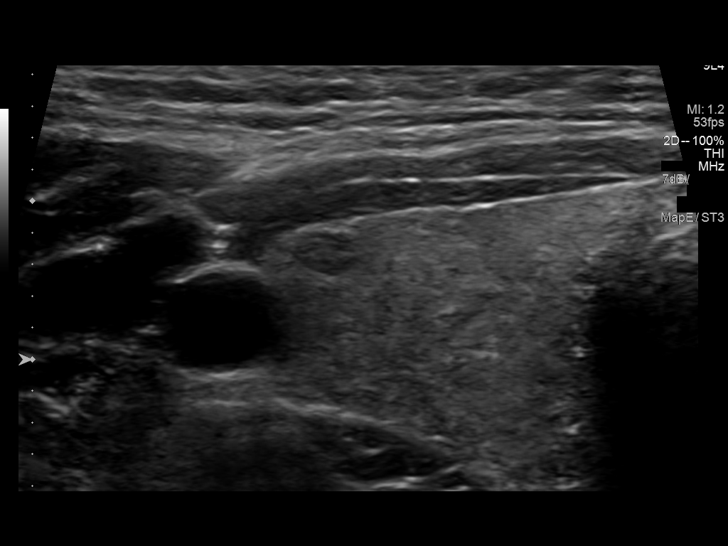
[im 20/40]
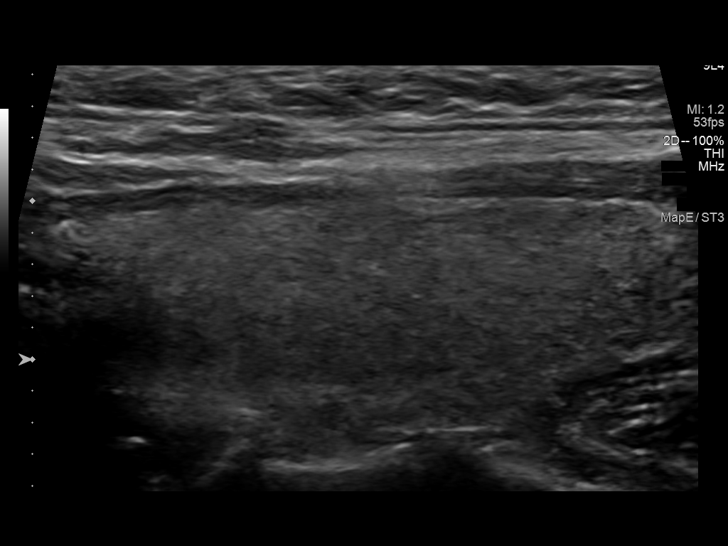
[im 23/40]
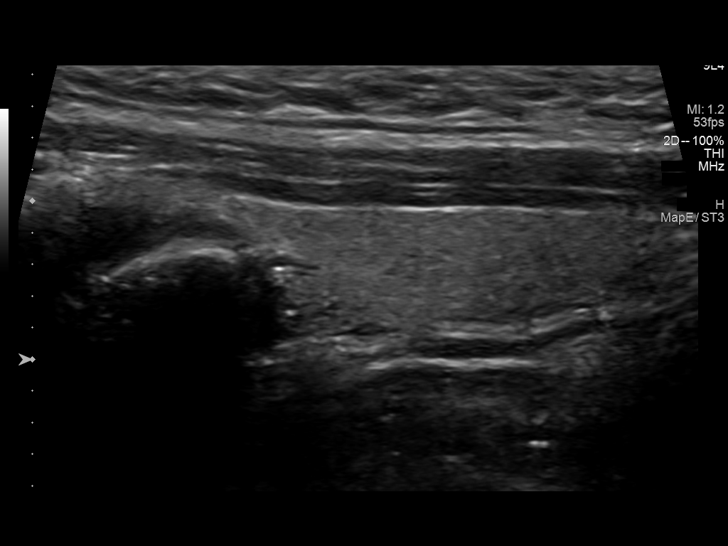
[im 27/40]
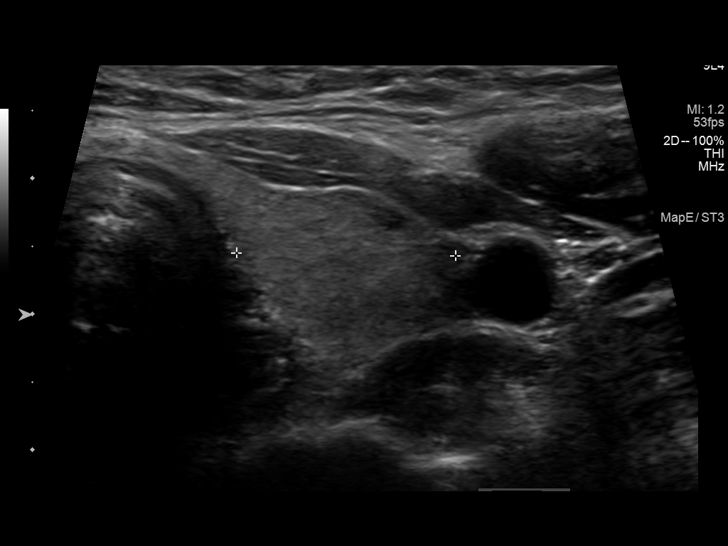
[im 30/40]
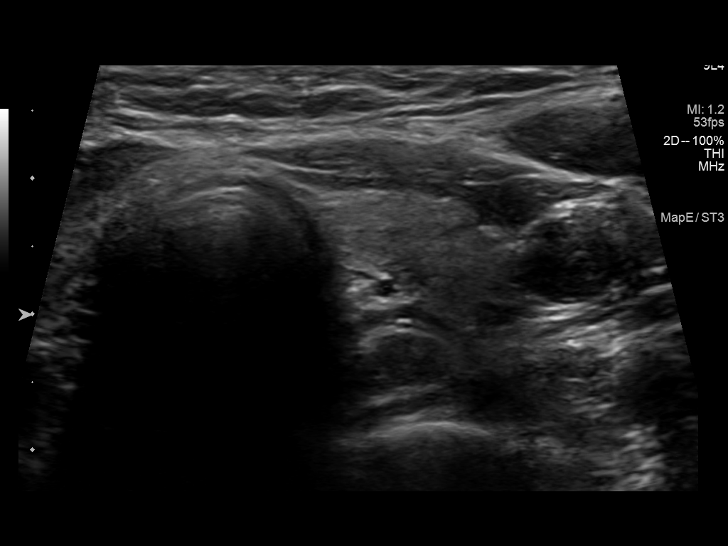
[im 33/40]
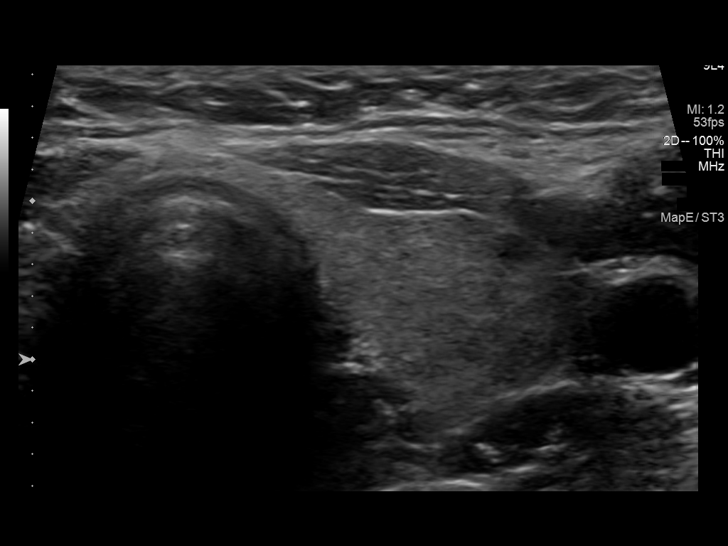
[im 36/40]
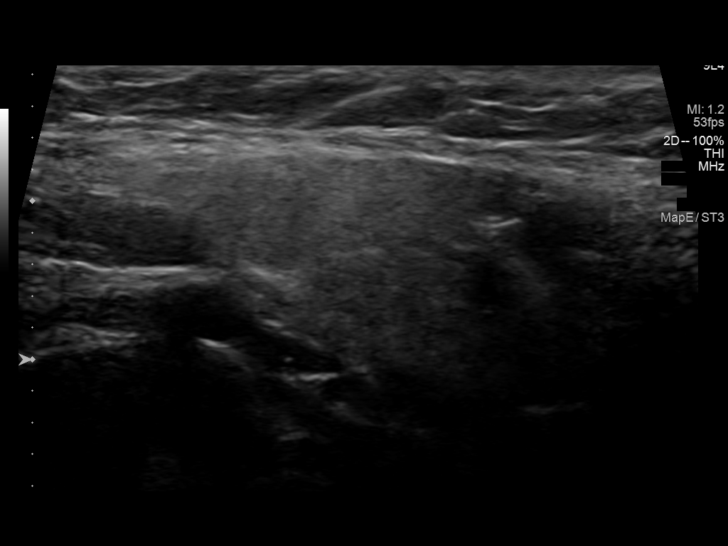
[im 40/40]
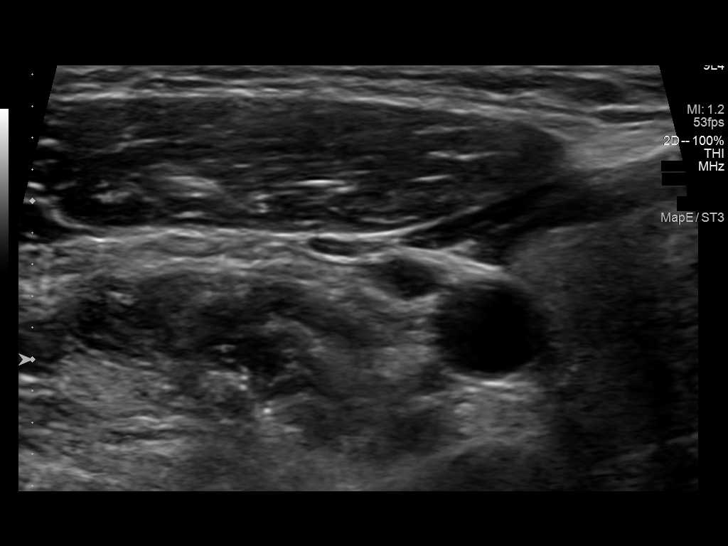

[13 of 25 positions shown; findings below may reference images not displayed]

FINDINGS: Parenchymal Echotexture: Mildly heterogenous

Isthmus: Normal in size measures 0.1 cm in diameter, unchanged

Right lobe: Normal in size measuring 4.2 x 1.9 x 1.4 cm, previously,
5.2 x 1.9 x 1.2 cm

Left lobe: Normal in size measuring 5.0 x 1.6 x 1.4 cm, previously,
5.1 x 1.5 x 1.2 cm.

_________________________________________________________

Estimated total number of nodules >/= 1 cm: 0

Number of spongiform nodules >/=  2 cm not described below (TR1): 0

Number of mixed cystic and solid nodules >/= 1.5 cm not described
below (TR2): 0

_________________________________________________________

Note is made of an approximately 0.5 x 0.4 x 0.3 cm hypoechoic
nodule within the superior pole the right lobe of the thyroid,
unchanged compared to the [DATE] examination, previously, 0.4 x
x 0.2 cm, and again does not meet criteria to recommend percutaneous
sampling or continued dedicated follow-up.

There is a punctate (approximately 0.3 cm) hypoechoic nodule within
mid aspect the left lobe of the thyroid which is also unchanged
compared to the [DATE] examination.
IMPRESSION: 1. Mildly heterogeneous but otherwise normal-appearing thyroid
without worrisome new or enlarging thyroid nodule.
2. Solitary punctate (sub 0.5 cm) bilateral thyroid nodules,
unchanged compared to the [DATE] examination and again do not meet
criteria to recommend percutaneous sampling or continued dedicated
follow-up.

The above is in keeping with the ACR TI-RADS recommendations - [HOSPITAL] 0283;[DATE].

## 2023-06-15 ENCOUNTER — Other Ambulatory Visit: Payer: Self-pay | Admitting: Obstetrics and Gynecology

## 2023-06-15 DIAGNOSIS — Z1231 Encounter for screening mammogram for malignant neoplasm of breast: Secondary | ICD-10-CM

## 2023-07-26 ENCOUNTER — Ambulatory Visit
Admission: RE | Admit: 2023-07-26 | Discharge: 2023-07-26 | Disposition: A | Discharge status: Home / Self Care | Source: Ambulatory Visit | Attending: Obstetrics and Gynecology | Admitting: Obstetrics and Gynecology

## 2023-07-26 DIAGNOSIS — Z1231 Encounter for screening mammogram for malignant neoplasm of breast: Secondary | ICD-10-CM

## 2024-09-20 ENCOUNTER — Other Ambulatory Visit: Payer: Self-pay | Admitting: Nurse Practitioner

## 2024-09-20 DIAGNOSIS — Z1231 Encounter for screening mammogram for malignant neoplasm of breast: Secondary | ICD-10-CM

## 2024-09-22 ENCOUNTER — Ambulatory Visit
Admission: RE | Admit: 2024-09-22 | Discharge: 2024-09-22 | Disposition: A | Source: Ambulatory Visit | Attending: Nurse Practitioner | Admitting: Nurse Practitioner

## 2024-09-22 DIAGNOSIS — Z1231 Encounter for screening mammogram for malignant neoplasm of breast: Secondary | ICD-10-CM
